# Patient Record
Sex: Female | Born: 1960 | Race: White | Hispanic: No | Marital: Married | Smoking: Never smoker
Health system: Southern US, Community
[De-identification: ages and names within clinical notes are randomized; demographics above are authoritative.]

## PROBLEM LIST (undated history)

## (undated) DIAGNOSIS — I10 Essential (primary) hypertension: Secondary | ICD-10-CM

## (undated) DIAGNOSIS — F329 Major depressive disorder, single episode, unspecified: Secondary | ICD-10-CM

## (undated) DIAGNOSIS — F419 Anxiety disorder, unspecified: Secondary | ICD-10-CM

## (undated) DIAGNOSIS — F32A Depression, unspecified: Secondary | ICD-10-CM

## (undated) HISTORY — DX: Anxiety disorder, unspecified: F41.9

## (undated) HISTORY — DX: Major depressive disorder, single episode, unspecified: F32.9

## (undated) HISTORY — PX: TONSILLECTOMY: SUR1361

## (undated) HISTORY — DX: Depression, unspecified: F32.A

## (undated) HISTORY — DX: Essential (primary) hypertension: I10

---

## 1981-03-14 HISTORY — PX: BREAST CYST EXCISION: SHX579

## 1981-03-14 HISTORY — PX: REDUCTION MAMMAPLASTY: SUR839

## 1981-03-14 HISTORY — PX: BREAST SURGERY: SHX581

## 1997-06-06 ENCOUNTER — Ambulatory Visit (HOSPITAL_COMMUNITY): Admission: RE | Admit: 1997-06-06 | Discharge: 1997-06-06 | Payer: Self-pay | Admitting: *Deleted

## 1997-08-19 ENCOUNTER — Ambulatory Visit (HOSPITAL_COMMUNITY): Admission: RE | Admit: 1997-08-19 | Discharge: 1997-08-19 | Payer: Self-pay | Admitting: Obstetrics and Gynecology

## 1997-08-27 ENCOUNTER — Ambulatory Visit (HOSPITAL_COMMUNITY): Admission: RE | Admit: 1997-08-27 | Discharge: 1997-08-27 | Payer: Self-pay | Admitting: Obstetrics and Gynecology

## 1997-11-15 ENCOUNTER — Inpatient Hospital Stay (HOSPITAL_COMMUNITY): Admission: AD | Admit: 1997-11-15 | Discharge: 1997-11-17 | Payer: Self-pay | Admitting: Obstetrics and Gynecology

## 1997-12-26 ENCOUNTER — Other Ambulatory Visit: Admission: RE | Admit: 1997-12-26 | Discharge: 1997-12-26 | Payer: Self-pay | Admitting: Obstetrics and Gynecology

## 2000-03-14 HISTORY — PX: FOOT SURGERY: SHX648

## 2003-08-26 ENCOUNTER — Encounter: Admission: RE | Admit: 2003-08-26 | Discharge: 2003-08-26 | Payer: Self-pay | Admitting: Obstetrics and Gynecology

## 2004-03-14 HISTORY — PX: KNEE SURGERY: SHX244

## 2004-08-26 ENCOUNTER — Ambulatory Visit (HOSPITAL_COMMUNITY): Admission: RE | Admit: 2004-08-26 | Discharge: 2004-08-26 | Payer: Self-pay | Admitting: Obstetrics and Gynecology

## 2004-09-10 ENCOUNTER — Encounter: Admission: RE | Admit: 2004-09-10 | Discharge: 2004-09-10 | Payer: Self-pay | Admitting: Obstetrics and Gynecology

## 2005-11-21 ENCOUNTER — Ambulatory Visit (HOSPITAL_COMMUNITY): Admission: RE | Admit: 2005-11-21 | Discharge: 2005-11-21 | Payer: Self-pay | Admitting: Obstetrics and Gynecology

## 2006-04-27 ENCOUNTER — Ambulatory Visit: Payer: Self-pay | Admitting: Obstetrics & Gynecology

## 2006-05-01 ENCOUNTER — Ambulatory Visit (HOSPITAL_COMMUNITY): Admission: RE | Admit: 2006-05-01 | Discharge: 2006-05-01 | Payer: Self-pay | Admitting: Gynecology

## 2006-05-02 ENCOUNTER — Ambulatory Visit: Payer: Self-pay | Admitting: Family Medicine

## 2006-05-16 ENCOUNTER — Ambulatory Visit: Payer: Self-pay | Admitting: Family Medicine

## 2006-06-16 ENCOUNTER — Ambulatory Visit (HOSPITAL_COMMUNITY): Admission: RE | Admit: 2006-06-16 | Discharge: 2006-06-16 | Payer: Self-pay | Admitting: Family Medicine

## 2006-06-16 ENCOUNTER — Ambulatory Visit: Payer: Self-pay | Admitting: Family Medicine

## 2006-07-11 ENCOUNTER — Ambulatory Visit: Payer: Self-pay | Admitting: Family Medicine

## 2006-08-22 ENCOUNTER — Emergency Department: Payer: Self-pay | Admitting: Emergency Medicine

## 2006-12-13 LAB — CONVERTED CEMR LAB: Pap Smear: NORMAL

## 2006-12-26 ENCOUNTER — Ambulatory Visit: Payer: Self-pay | Admitting: Family Medicine

## 2006-12-26 ENCOUNTER — Encounter: Payer: Self-pay | Admitting: Family Medicine

## 2007-02-27 ENCOUNTER — Ambulatory Visit (HOSPITAL_COMMUNITY): Admission: RE | Admit: 2007-02-27 | Discharge: 2007-02-27 | Payer: Self-pay | Admitting: Gynecology

## 2007-04-25 ENCOUNTER — Ambulatory Visit: Payer: Self-pay | Admitting: Family Medicine

## 2007-04-25 DIAGNOSIS — F411 Generalized anxiety disorder: Secondary | ICD-10-CM | POA: Insufficient documentation

## 2007-04-25 DIAGNOSIS — I10 Essential (primary) hypertension: Secondary | ICD-10-CM

## 2007-04-25 DIAGNOSIS — F329 Major depressive disorder, single episode, unspecified: Secondary | ICD-10-CM

## 2007-04-25 LAB — CONVERTED CEMR LAB
Bilirubin Urine: NEGATIVE
Ketones, urine, test strip: NEGATIVE
Nitrite: NEGATIVE
Specific Gravity, Urine: 1.015
pH: 7

## 2007-04-27 ENCOUNTER — Ambulatory Visit: Payer: Self-pay | Admitting: Family Medicine

## 2007-04-30 LAB — CONVERTED CEMR LAB
Albumin: 4.2 g/dL (ref 3.5–5.2)
BUN: 14 mg/dL (ref 6–23)
Basophils Absolute: 0 10*3/uL (ref 0.0–0.1)
Cholesterol: 185 mg/dL (ref 0–200)
Creatinine, Ser: 0.7 mg/dL (ref 0.4–1.2)
GFR calc Af Amer: 115 mL/min
HCT: 35.4 % — ABNORMAL LOW (ref 36.0–46.0)
HDL: 68.5 mg/dL (ref >39.0)
Hemoglobin: 11.9 g/dL — ABNORMAL LOW (ref 12.0–15.0)
LDL Cholesterol: 106 mg/dL — ABNORMAL HIGH (ref 0–99)
Lymphocytes Relative: 32.9 % (ref 12.0–46.0)
MCHC: 33.7 g/dL (ref 30.0–36.0)
MCV: 95.8 fL (ref 78.0–100.0)
Monocytes Absolute: 0.6 10*3/uL (ref 0.2–0.7)
Monocytes Relative: 10.8 % (ref 3.0–11.0)
Neutro Abs: 3.1 10*3/uL (ref 1.4–7.7)
Neutrophils Relative %: 52.8 % (ref 43.0–77.0)
Potassium: 4.6 meq/L (ref 3.5–5.1)
RDW: 12 % (ref 11.5–14.6)
Sodium: 139 meq/L (ref 135–145)
TSH: 0.8 microintl units/mL (ref 0.35–5.50)
Total Bilirubin: 0.7 mg/dL (ref 0.3–1.2)
Total Protein: 6.7 g/dL (ref 6.0–8.3)

## 2007-05-06 ENCOUNTER — Ambulatory Visit: Payer: Self-pay | Admitting: Family Medicine

## 2007-05-09 ENCOUNTER — Ambulatory Visit: Payer: Self-pay | Admitting: Family Medicine

## 2007-10-10 ENCOUNTER — Ambulatory Visit: Payer: Self-pay | Admitting: Family Medicine

## 2007-10-10 LAB — CONVERTED CEMR LAB
ALT: 11 units/L (ref 0–35)
AST: 21 units/L (ref 0–37)
Alkaline Phosphatase: 53 units/L (ref 39–117)
Bilirubin Urine: NEGATIVE
Bilirubin, Direct: 0.1 mg/dL (ref 0.0–0.3)
CO2: 31 meq/L (ref 19–32)
Chloride: 101 meq/L (ref 96–112)
GFR calc Af Amer: 114 mL/min
Glucose, Bld: 90 mg/dL (ref 70–99)
Glucose, Urine, Semiquant: 250
Lymphocytes Relative: 32.1 % (ref 12.0–46.0)
Monocytes Relative: 10.6 % (ref 3.0–12.0)
Platelets: 193 10*3/uL (ref 150–400)
Potassium: 4.3 meq/L (ref 3.5–5.1)
RDW: 12 % (ref 11.5–14.6)
Sodium: 138 meq/L (ref 135–145)
Specific Gravity, Urine: 1.02
Total Protein: 6.6 g/dL (ref 6.0–8.3)
WBC Urine, dipstick: NEGATIVE
WBC: 6.5 10*3/uL (ref 4.5–10.5)
pH: 6

## 2007-10-11 ENCOUNTER — Encounter: Payer: Self-pay | Admitting: Family Medicine

## 2007-10-19 ENCOUNTER — Encounter: Payer: Self-pay | Admitting: Family Medicine

## 2007-11-22 ENCOUNTER — Encounter: Payer: Self-pay | Admitting: Family Medicine

## 2007-12-10 ENCOUNTER — Encounter: Payer: Self-pay | Admitting: Family Medicine

## 2007-12-10 ENCOUNTER — Ambulatory Visit: Payer: Self-pay | Admitting: Gastroenterology

## 2007-12-13 ENCOUNTER — Telehealth: Payer: Self-pay | Admitting: Family Medicine

## 2007-12-13 DIAGNOSIS — N83209 Unspecified ovarian cyst, unspecified side: Secondary | ICD-10-CM

## 2007-12-14 ENCOUNTER — Encounter: Payer: Self-pay | Admitting: Family Medicine

## 2008-01-07 ENCOUNTER — Encounter: Payer: Self-pay | Admitting: Family Medicine

## 2008-02-28 ENCOUNTER — Encounter: Payer: Self-pay | Admitting: Family Medicine

## 2008-03-05 ENCOUNTER — Telehealth: Payer: Self-pay | Admitting: Family Medicine

## 2008-05-02 ENCOUNTER — Encounter (INDEPENDENT_AMBULATORY_CARE_PROVIDER_SITE_OTHER): Payer: Self-pay | Admitting: *Deleted

## 2008-05-28 ENCOUNTER — Encounter: Payer: Self-pay | Admitting: Family Medicine

## 2008-10-10 ENCOUNTER — Ambulatory Visit: Payer: Self-pay | Admitting: Family Medicine

## 2008-10-14 ENCOUNTER — Ambulatory Visit: Payer: Self-pay | Admitting: Family Medicine

## 2008-10-15 LAB — CONVERTED CEMR LAB
ALT: 10 units/L (ref 0–35)
Basophils Relative: 0.2 % (ref 0.0–3.0)
Bilirubin, Direct: 0 mg/dL (ref 0.0–0.3)
Chloride: 106 meq/L (ref 96–112)
Creatinine, Ser: 0.6 mg/dL (ref 0.4–1.2)
Eosinophils Absolute: 0.2 10*3/uL (ref 0.0–0.7)
Hemoglobin: 12.1 g/dL (ref 12.0–15.0)
LDL Cholesterol: 93 mg/dL (ref 0–99)
MCHC: 34.7 g/dL (ref 30.0–36.0)
MCV: 95.7 fL (ref 78.0–100.0)
Monocytes Absolute: 0.7 10*3/uL (ref 0.1–1.0)
Neutro Abs: 5.9 10*3/uL (ref 1.4–7.7)
Neutrophils Relative %: 66.6 % (ref 43.0–77.0)
RBC: 3.64 M/uL — ABNORMAL LOW (ref 3.87–5.11)
Total Bilirubin: 0.7 mg/dL (ref 0.3–1.2)
Total CHOL/HDL Ratio: 3
Triglycerides: 106 mg/dL (ref 0.0–149.0)

## 2008-10-23 ENCOUNTER — Encounter: Payer: Self-pay | Admitting: Family Medicine

## 2008-11-19 ENCOUNTER — Ambulatory Visit: Payer: Self-pay | Admitting: Family Medicine

## 2008-11-19 ENCOUNTER — Encounter: Payer: Self-pay | Admitting: Family Medicine

## 2008-11-19 DIAGNOSIS — R1011 Right upper quadrant pain: Secondary | ICD-10-CM | POA: Insufficient documentation

## 2008-11-20 LAB — CONVERTED CEMR LAB
Basophils Absolute: 0 10*3/uL (ref 0.0–0.1)
Bilirubin, Direct: 0 mg/dL (ref 0.0–0.3)
Calcium: 9.1 mg/dL (ref 8.4–10.5)
Creatinine, Ser: 0.6 mg/dL (ref 0.4–1.2)
Eosinophils Absolute: 0.1 10*3/uL (ref 0.0–0.7)
HCT: 33.3 % — ABNORMAL LOW (ref 36.0–46.0)
Hemoglobin: 11.3 g/dL — ABNORMAL LOW (ref 12.0–15.0)
Lipase: 51 units/L (ref 11.0–59.0)
Lymphs Abs: 1.3 10*3/uL (ref 0.7–4.0)
MCHC: 33.9 g/dL (ref 30.0–36.0)
MCV: 97.3 fL (ref 78.0–100.0)
Neutro Abs: 3.8 10*3/uL (ref 1.4–7.7)
RDW: 12.4 % (ref 11.5–14.6)
Total Bilirubin: 0.9 mg/dL (ref 0.3–1.2)
Total Protein: 6.2 g/dL (ref 6.0–8.3)

## 2008-11-21 ENCOUNTER — Ambulatory Visit: Payer: Self-pay | Admitting: Family Medicine

## 2008-11-21 ENCOUNTER — Encounter: Payer: Self-pay | Admitting: Family Medicine

## 2008-11-21 LAB — CONVERTED CEMR LAB
ALT: 62 units/L — ABNORMAL HIGH (ref 0–35)
AST: 46 units/L — ABNORMAL HIGH (ref 0–37)
Albumin: 3.8 g/dL (ref 3.5–5.2)
Alkaline Phosphatase: 69 units/L (ref 39–117)
Basophils Absolute: 0 10*3/uL (ref 0.0–0.1)
Basophils Relative: 0.5 % (ref 0.0–3.0)
Eosinophils Relative: 3 % (ref 0.0–5.0)
GFR calc non Af Amer: 112.26 mL/min (ref >60)
Glucose, Bld: 96 mg/dL (ref 70–99)
HCT: 32.2 % — ABNORMAL LOW (ref 36.0–46.0)
Hemoglobin: 11 g/dL — ABNORMAL LOW (ref 12.0–15.0)
Lymphs Abs: 1.5 10*3/uL (ref 0.7–4.0)
Monocytes Relative: 11.5 % (ref 3.0–12.0)
Neutro Abs: 2.8 10*3/uL (ref 1.4–7.7)
Potassium: 4.8 meq/L (ref 3.5–5.1)
RBC: 3.36 M/uL — ABNORMAL LOW (ref 3.87–5.11)
RDW: 12.2 % (ref 11.5–14.6)
Sodium: 142 meq/L (ref 135–145)
Total Protein: 6.7 g/dL (ref 6.0–8.3)

## 2008-11-25 LAB — CONVERTED CEMR LAB: Hepatitis B Surface Ag: NEGATIVE

## 2008-12-10 ENCOUNTER — Ambulatory Visit: Payer: Self-pay | Admitting: Family Medicine

## 2008-12-11 LAB — CONVERTED CEMR LAB
ALT: 15 units/L (ref 0–35)
AST: 21 units/L (ref 0–37)
Albumin: 3.9 g/dL (ref 3.5–5.2)
Alkaline Phosphatase: 46 units/L (ref 39–117)
Total Protein: 6.5 g/dL (ref 6.0–8.3)

## 2009-04-03 ENCOUNTER — Ambulatory Visit: Payer: Self-pay | Admitting: Family Medicine

## 2009-04-03 ENCOUNTER — Other Ambulatory Visit: Admission: RE | Admit: 2009-04-03 | Discharge: 2009-04-03 | Payer: Self-pay | Admitting: Family Medicine

## 2009-04-06 ENCOUNTER — Encounter: Admission: RE | Admit: 2009-04-06 | Discharge: 2009-04-06 | Payer: Self-pay | Admitting: Family Medicine

## 2009-04-07 ENCOUNTER — Encounter (INDEPENDENT_AMBULATORY_CARE_PROVIDER_SITE_OTHER): Payer: Self-pay | Admitting: *Deleted

## 2009-04-07 LAB — CONVERTED CEMR LAB: Pap Smear: NEGATIVE

## 2009-05-05 ENCOUNTER — Ambulatory Visit: Payer: Self-pay | Admitting: Family Medicine

## 2009-05-06 ENCOUNTER — Telehealth: Payer: Self-pay | Admitting: Family Medicine

## 2009-05-08 ENCOUNTER — Ambulatory Visit: Payer: Self-pay | Admitting: Cardiology

## 2009-07-16 ENCOUNTER — Ambulatory Visit: Payer: Self-pay | Admitting: Internal Medicine

## 2009-10-28 ENCOUNTER — Ambulatory Visit: Payer: Self-pay | Admitting: Family Medicine

## 2009-10-28 ENCOUNTER — Encounter (INDEPENDENT_AMBULATORY_CARE_PROVIDER_SITE_OTHER): Payer: Self-pay | Admitting: *Deleted

## 2009-10-28 ENCOUNTER — Encounter: Admission: RE | Admit: 2009-10-28 | Discharge: 2009-10-28 | Payer: Self-pay | Admitting: Family Medicine

## 2009-10-30 ENCOUNTER — Ambulatory Visit: Payer: Self-pay | Admitting: Family Medicine

## 2009-11-02 ENCOUNTER — Telehealth: Payer: Self-pay | Admitting: Family Medicine

## 2009-11-06 ENCOUNTER — Encounter: Admission: RE | Admit: 2009-11-06 | Discharge: 2009-11-06 | Payer: Self-pay | Admitting: Family Medicine

## 2009-11-11 ENCOUNTER — Ambulatory Visit: Payer: Self-pay | Admitting: Family Medicine

## 2010-01-26 ENCOUNTER — Ambulatory Visit: Payer: Self-pay | Admitting: Family Medicine

## 2010-04-13 NOTE — Progress Notes (Signed)
Summary: headache  Phone Note Call from Patient Call back at [phone]   Caller: Patient Call For: Kerby Nora MD Summary of Call: Patient says that she still has the constant headache. This has been going on for over a week. Patient is asking what is the next step. She says that Dr. Ermalene Searing had mentioned her having an MRI.  Initial call taken by: Melody Comas,  November 02, 2009 11:37 AM  Follow-up for Phone Call        she needs to be reevaluated with headaches that bad. Follow-up by: Hannah Beat MD,  November 02, 2009 11:44 AM  Additional Follow-up for Phone Call Additional follow up Details #1::        Patient says that she was just in the office on Friday to see Dr. Ermalene Searing and she does not want to come back in to be seen again, she says that she cannot keep paying the copays because it adds up.  She doesn't see the need to come in to see the doctor again.  Dr. Patsy Lager spoke with patient on the telephone.  Consuello Masse CMA   Additional Follow-up by: Benny Lennert CMA [name]),  November 02, 2009 12:22 PM    Additional Follow-up for Phone Call Additional follow up Details #2::    MRI ordered. Follow-up by: Hannah Beat MD,  November 02, 2009 12:55 PM

## 2010-04-13 NOTE — Letter (Signed)
Summary: Out of Work  [employer] [facility]  [address] [place], [place] [address]   Phone: [phone]  Fax: [phone]    October 28, 2009   Employee:  [facility]    To Whom It May Concern:   For Medical reasons, please excuse the above named employee from work for the following dates:  Start:  October 28, 2009 4:19 PM   End:   May return to work August 19th Night after follow up if better  If you need additional information, please feel free to contact our office.         Sincerely,   Kerby Nora MD

## 2010-04-13 NOTE — Assessment & Plan Note (Signed)
Summary: CPX / LFW   Vital Signs:  Patient profile:   50 year old female Height:      65 inches Weight:      131.2 pounds BMI:     21.91 Temp:     97.8 degrees F oral Pulse rate:   72 / minute Pulse rhythm:   regular BP sitting:   140 / 80  (left arm) Cuff size:   regular  Vitals Entered By: Benny Lennert CMA [name]) (April 03, 2009 12:10 PM)  History of Present Illness: Chief complaint cpx  The patient is here for annual wellness exam and preventative care.     Doing well overall.   Depression, well controlled on celexa. needs refills.   Problems Prior to Update: 1)  Cough  (ICD-786.2) 2)  Routine Gynecological Examination  (ICD-V72.31) 3)  Abdominal Pain, Right Upper Quadrant  (ICD-789.01) 4)  Family History Breast Cancer 1st Degree Relative <50  (ICD-V16.3) 5)  Other Screening Mammogram  (ICD-V76.12) 6)  Colitis, Collagenous  (ICD-558.9) 7)  Ovarian Cyst, Left  (ICD-620.2) 8)  Low Back Pain, Acute  (ICD-724.2) 9)  Abdominal Pain, Right Upper Quadrant  (ICD-789.01) 10)  Well Adult Exam  (ICD-V70.0) 11)  Depression  (ICD-311) 12)  Anxiety  (ICD-300.00) 13)  Hypertension  (ICD-401.9)  Current Medications (verified): 1)  Celexa 40 Mg  Tabs (Citalopram Hydrobromide) .... Take 1 Tablet By Mouth Once A Day 2)  Multivitamins   Tabs (Multiple Vitamin) .... Take 1 Tablet By Mouth Once A Day 3)  Caltrate 600+d 600-400 Mg-Unit  Tabs (Calcium Carbonate-Vitamin D) .... Take 1 Tablet By Mouth Once A Day 4)  Fish Oil Concentrate 1000 Mg  Caps (Omega-3 Fatty Acids) .... Take 1 Capsule By Mouth Two Times A Day  Allergies (verified): No Known Drug Allergies  Past History:  Past medical, surgical, family and social histories (including risk factors) reviewed, and no changes noted (except as noted below).  Past Medical History: Reviewed history from 04/25/2007 and no changes required. Hypertension Anxiety Depression  Past Surgical History: Reviewed history from  04/25/2007 and no changes required. 2002 R foot surgery  2006 L knee surgery, torn ligiments Tonsillectomy breast reduction 1983  Family History: Reviewed history from 10/10/2008 and no changes required. father: healthy mother: HTN, CAD (MI at age 3), melanoma, osteoporosi MGM; breast cancer MGF: colon cancer sister: breast cancer brother CAD, CVA Family History Breast cancer 1st degree relative <50  Social History: Reviewed history from 04/25/2007 and no changes required. 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 no breast lesions, no nipple discharge. General:  Complains of fatigue and sweats; denies fever and weight loss. CV:  Denies chest pain or discomfort. Resp:  Complains of cough; denies shortness of breath and sputum productive;  Upon quesstioning about lung exam: grey mucus, productive cough x 1 month, pain in left upper chest  rare left scapular pain long history of second hand smoke and daily marijuana use. GI:  Denies abdominal pain, bloody stools, constipation, and diarrhea. GU:  Denies dysuria.  Physical Exam  General:  Well-developed,well-nourished,in no acute distress; alert,appropriate and cooperative throughout examination Eyes:  No corneal or conjunctival inflammation noted. EOMI. Perrla. Funduscopic exam benign, without hemorrhages,  exudates or papilledema. Vision grossly normal. Ears:  External ear exam shows no significant lesions or deformities.  Otoscopic examination reveals clear canals, tympanic membranes are intact bilaterally without bulging, retraction, inflammation or discharge. Hearing is grossly normal bilaterally. Nose:  External nasal examination shows no deformity  or inflammation. Nasal mucosa are pink and moist without lesions or exudates. Mouth:  Oral mucosa and oropharynx without lesions or exudates.  Teeth in good repair. Neck:  no carotid bruit or thyromegaly no cervical or supraclavicular lymphadenopathy  Chest Wall:  No deformities, masses, or tenderness noted. Breasts:  No mass, nodules, thickening, tenderness, bulging, retraction, inflamation, nipple discharge or skin changes noted.   Lungs:  Normal respiratory effort, chest expands symmetrically.? rhonchi on inspiration in left upper lung, apex..? rub Heart:  Normal rate and regular rhythm. S1 and S2 normal without gallop, murmur, click, rub or other extra sounds. Abdomen:  Bowel sounds positive,abdomen soft and non-tender without masses, organomegaly or hernias noted. Genitalia:  Normal introitus for age, no external lesions, no vaginal discharge, mucosa pink and moist, no vaginal or cervical lesions, no vaginal atrophy, no friaility or hemorrhage, normal uterus size and position, no adnexal masses or tenderness Pulses:  R and L posterior tibial pulses are full and equal bilaterally  Extremities:  no edema  Skin:  Intact without suspicious lesions or rashes Psych:  Cognition and judgment appear intact. Alert and cooperative with normal attention span and concentration. No apparent delusions, illusions, hallucinations  Denies SI.   Impression & Recommendations:  Problem # 1:  WELL ADULT EXAM (ICD-V70.0) Reviewed preventive care protocols, scheduled due services, and updated immunizations. Encouraged exercise, weight loss, healthy eating habits.   Problem # 2:  Gynecological examination-routine (ICD-V72.31) PAp pending.   Problem # 3:  COUGH (ICD-786.2) Abnormal lung exam on left apex. Given risk factors,and symptoms elicited from pt reluctantly on questioning..will cehck CXR for further eval.  Orders: Radiology Referral (Radiology)  Complete Medication List: 1)  Celexa 40 Mg Tabs  (Citalopram hydrobromide) .... Take 1 tablet by mouth once a day 2)  Multivitamins Tabs (Multiple vitamin) .... Take 1 tablet by mouth once a day 3)  Caltrate 600+d 600-400 Mg-unit Tabs (Calcium carbonate-vitamin d) .... Take 1 tablet by mouth once a day 4)  Fish Oil Concentrate 1000 Mg Caps (Omega-3 fatty acids) .... Take 1 capsule by mouth two times a day  Other Orders: Tdap => 54yrs IM (73220) Admin 1st Vaccine (25427) Admin 1st Vaccine [facility]) [phone])  Patient Instructions: 1)  Follow BP at home.Marland Kitchengoal BP 140/90 2)  Please schedule a follow-up appointment in 1 year.  3)  Referral Appointment Information 4)  Day/Date: 5)  Time: 6)  Place/MD: 7)  Address: 8)  Phone/Fax: 9)  Patient given appointment information. Information/Orders faxed/mailed.  Prescriptions: CELEXA 40 MG  TABS (CITALOPRAM HYDROBROMIDE) Take 1 tablet by mouth once a day  #90 x 3   Entered and Authorized by:   Kerby Nora MD   Signed by:   Kerby Nora MD on 04/03/2009   Method used:   Electronically to        [facility]  [address] (retail)       [address]       [place], [place]  [address]       Ph: [phone]       Fax: [phone]   RxID:   [id number]   Current Allergies (reviewed today): No known allergies  Flu  Vaccine Next Due:  Not Indicated TD Result Date:  04/03/2009 TD Result:  given TD Next Due:  10 yr    Past Medical History:    Reviewed history from 04/25/2007 and no changes required:       Hypertension       Anxiety       Depression  Past Surgical History:    Reviewed history from 04/25/2007 and no changes required:       2002 R foot surgery        2006 L knee surgery, torn ligiments       Tonsillectomy       breast reduction 1983      Tetanus/Td Vaccine    Vaccine Type: Tdap    Site: left deltoid    Mfr: GlaxoSmithKline    Dose: 0.5 ml    Route: IM    Given by: Benny Lennert CMA (AAMA)    Exp. Date: 05/09/2011    Lot #:  [id number]

## 2010-04-13 NOTE — Assessment & Plan Note (Signed)
Summary: F/U/CLE   Vital Signs:  Patient profile:   50 year old female Height:      65 inches Weight:      130.8 pounds BMI:     21.84 Temp:     98.5 degrees F oral Pulse rate:   72 / minute Pulse rhythm:   regular BP sitting:   150 / 92  (left arm) Cuff size:   regular  Vitals Entered By: Benny Lennert CMA [name]) (October 30, 2009 10:50 AM)  History of Present Illness: Chief complaint follow up severe headache  Severe headache: CT scan head negative. Movement makes it worse. Given phenergan, vicodin for pain. No history of migraines or headache.  She states headhace is tolerable but requiring vicodin every 5-6 hours..pain level at 4-5/10. Pain in occiput.  No new symptoms. No numbness, tingling, no weakness, no nausea (not needing phenergan since pain better).  Eating and drinking well.   Started lisinopril/HCTZ... improved some todfay but remains >140/90.  Mother with HTN, no family history of migraine, no aneyrsym  Allergies (verified): No Known Drug Allergies  Past History:  Past medical, surgical, family and social histories (including risk factors) reviewed, and no changes noted (except as noted below).  Past Medical History: Reviewed history from 04/25/2007 and no changes required. Hypertension Anxiety Depression  Past Surgical History: Reviewed history from 04/25/2007 and no changes required. 2002 R foot surgery  2006 L knee surgery, torn ligiments Tonsillectomy breast reduction 1983  Family History: Reviewed history from 10/10/2008 and no changes required. father: healthy mother: HTN, CAD (MI at age 61), melanoma, osteoporosi MGM; breast cancer MGF: colon cancer sister: breast cancer brother CAD, CVA Family History Breast cancer 1st degree relative <50  Social History: Reviewed history from 04/25/2007 and no changes required. 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 No tick bites.  General:  Denies fatigue and fever. CV:  Denies chest pain or discomfort. Resp:  Denies shortness of breath.  Physical Exam  General:  Well-developed,well-nourished,in no acute distress; alert,appropriate and cooperative throughout examination Head:  Normocephalic and atraumatic without obvious abnormalities. No apparent alopecia or balding. Eyes:  No corneal or conjunctival inflammation noted. EOMI. Perrla. Funduscopic exam benign, without hemorrhages, exudates or papilledema. Vision grossly normal. Ears:  External ear exam shows no significant lesions or deformities.  Otoscopic examination reveals clear canals, tympanic membranes are intact bilaterally without bulging, retraction, inflammation or discharge. Hearing is grossly normal bilaterally. Nose:  External nasal examination shows no deformity or inflammation. Nasal mucosa are pink and moist without lesions or exudates. Mouth:  Oral mucosa and oropharynx without lesions or exudates.  Teeth in good repair. Neck:  no carotid bruit or thyromegaly no cervical or supraclavicular lymphadenopathy Full ROM neck Lungs:  Normal respiratory effort, chest expands symmetrically. Lungs are clear to auscultation, no crackles or wheezes. Heart:  Normal rate and regular rhythm. S1 and S2 normal without gallop, murmur, click, rub or other extra sounds. Pulses:  R and L posterior tibial pulses are full and equal bilaterally  Extremities:  no edema Neurologic:  No cranial nerve deficits noted. Station and gait are normal. Plantar reflexes are down-going bilaterally. DTRs are symmetrical throughout. Sensory, motor and coordinative  functions appear intact.   Impression & Recommendations:  Problem # 1:  HEADACHE,  SEVERE (ICD-784.0) Possibly due to BP...still with concern of other etiology given still requiring vicodin. Will treat BP more aggressively ..follow.  Consider MRi if headahce not improving.  No clear suggestion of LYme disease, RMSF.   Her updated medication list for this problem includes:    Hydrocodone-acetaminophen 5-500 Mg Tabs (Hydrocodone-acetaminophen) .Marland Kitchen... 1 tab by mouth poq6 hours as needed pain  Problem # 2:  HYPERTENSION (ICD-401.9) Increase to 2 tab by mouth daily.Marland KitchenMarland KitchenCheck Cr at next OV.  Her updated medication list for this problem includes:    Lisinopril-hydrochlorothiazide 10-12.5 Mg Tabs (Lisinopril-hydrochlorothiazide) .Marland Kitchen... 1 tab by mouth daily  Complete Medication List: 1)  Celexa 40 Mg Tabs (Citalopram hydrobromide) .... Take 1 tablet by mouth once a day 2)  Multivitamins Tabs (Multiple vitamin) .... Take 1 tablet by mouth once a day 3)  Caltrate 600+d 600-400 Mg-unit Tabs (Calcium carbonate-vitamin d) .... Take 1 tablet by mouth once a day 4)  Fish Oil Concentrate 1000 Mg Caps (Omega-3 fatty acids) .... Take 2  capsules  by mouth once daily 5)  Healthy Colon Caps (Probiotic product) .... Once daily 6)  Lisinopril-hydrochlorothiazide 10-12.5 Mg Tabs (Lisinopril-hydrochlorothiazide) .Marland Kitchen.. 1 tab by mouth daily 7)  Hydrocodone-acetaminophen 5-500 Mg Tabs (Hydrocodone-acetaminophen) .Marland Kitchen.. 1 tab by mouth poq6 hours as needed pain 8)  Promethazine Hcl 25 Mg Tabs (Promethazine hcl) .Marland Kitchen.. 1 tab by mouth q 6 hours as needed nausea  Patient Instructions: 1)  Increase BP med to 2 tab by mouth daily. 2)  Follow BPs at home.  3)  Follow up in 1-2 weeks HTN check.  4)  Go to ER if severe pain returns or vicodin not controlling headache. Prescriptions: HYDROCODONE-ACETAMINOPHEN 5-500 MG TABS (HYDROCODONE-ACETAMINOPHEN) 1 tab by mouth poq6 hours as needed pain  #30 x 0   Entered and Authorized by:   Kerby Nora MD   Signed by:   Kerby Nora MD on 10/30/2009   Method used:   Print  then Give to Patient   RxID:   [id number]   Current Allergies (reviewed today): No known allergies

## 2010-04-13 NOTE — Assessment & Plan Note (Signed)
Summary: lung exam per Bridget Reeves/hmw   Vital Signs:  Patient profile:   50 year old female Height:      65 inches Weight:      132.38 pounds BMI:     22.11 Temp:     98.2 degrees F oral Pulse rate:   72 / minute Pulse rhythm:   regular BP sitting:   132 / 60  (left arm) Cuff size:   regular  Vitals Entered By: [name] CMA [name]) (May 05, 2009 11:07 AM) CC: Lung exam per Dr. Ermalene Searing   History of Present Illness: Chronic cough and abnormal lunmg exam in marijuana smoker (daily Had neg CXR. Cough, hacking in AMS... productive. No SOB, no wheeze. Sometime difficult to get full deep breath.  Slight upper back pain between shoulder.  Some night sweats. no weight loss. Some nausea in last week.   Good appetitie.  No fatigue.   Problems Prior to Update: 1)  Cough  (ICD-786.2) 2)  Routine Gynecological Examination  (ICD-V72.31) 3)  Abdominal Pain, Right Upper Quadrant  (ICD-789.01) 4)  Family History Breast Cancer 1st Degree Relative <50  (ICD-V16.3) 5)  Other Screening Mammogram  (ICD-V76.12) 6)  Colitis, Collagenous  (ICD-558.9) 7)  Ovarian Cyst, Left  (ICD-620.2) 8)  Low Back Pain, Acute  (ICD-724.2) 9)  Abdominal Pain, Right Upper Quadrant  (ICD-789.01) 10)  Well Adult Exam  (ICD-V70.0) 11)  Depression  (ICD-311) 12)  Anxiety  (ICD-300.00) 13)  Hypertension  (ICD-401.9)  Current Medications (verified): 1)  Celexa 40 Mg  Tabs (Citalopram Hydrobromide) .... Take 1 Tablet By Mouth Once A Day 2)  Multivitamins   Tabs (Multiple Vitamin) .... Take 1 Tablet By Mouth Once A Day 3)  Caltrate 600+d 600-400 Mg-Unit  Tabs (Calcium Carbonate-Vitamin D) .... Take 1 Tablet By Mouth Once A Day 4)  Fish Oil Concentrate 1000 Mg  Caps (Omega-3 Fatty Acids) .... Take 2  Capsules  By Mouth Once Daily 5)  Healthy Colon  Caps (Probiotic Product) .... Once Daily  Allergies (verified): No Known Drug Allergies  Past History:  Past medical, surgical, family and social histories  (including risk factors) reviewed, and no changes noted (except as noted below).  Past Medical History: Reviewed history from 04/25/2007 and no changes required. Hypertension Anxiety Depression  Past Surgical History: Reviewed history from 04/25/2007 and no changes required. 2002 R foot surgery  2006 L knee surgery, torn ligiments Tonsillectomy breast reduction 1983  Family History: Reviewed history from 10/10/2008 and no changes required. father: healthy mother: HTN, CAD (MI at age 69), melanoma, osteoporosi MGM; breast cancer MGF: colon cancer sister: breast cancer brother CAD, CVA Family History Breast cancer 1st degree relative <50  Social History: Reviewed history from 04/25/2007 and no changes required. 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 CV:  Denies chest pain or discomfort. Resp:  Denies coughing up blood and shortness of breath. GI:  Complains of abdominal pain; rare . GU:  Denies dysuria.  Physical Exam  General:  Well-developed,well-nourished,in no acute distress; alert,appropriate and cooperative throughout examination Ears:  External ear exam shows no significant lesions or deformities.  Otoscopic examination reveals clear canals, tympanic membranes are intact bilaterally without bulging, retraction, inflammation or discharge. Hearing is grossly normal bilaterally. Nose:  External nasal examination shows no deformity or inflammation. Nasal mucosa are pink and moist without lesions or exudates. Mouth:  Oral mucosa and oropharynx without lesions or exudates.  Teeth in good repair. Neck:  ? more prominant left shoulder  ? left suprclavicular prominence..no specific adenopathy  palpated Lungs:  Normal respiratory effort, chest expands symmetrically. Definate rhonchi on inspiration in left upper lung, apex... sounds like rubbing...may be louder than at last exam.  Heart:  Normal rate and regular rhythm. S1 and S2 normal without gallop, murmur, click, rub or other extra sounds.   Impression & Recommendations:  Problem # 1:  NONSPCIFC ABN FINDING RAD & OTH EXAM LUNG FIELD (ICD-793.1) Given some red flags and continued abnormal lung sounds in marijuana smoker...will eval further with Chest Ct with contrast. Pt agreeable.  Orders: Radiology Referral (Radiology)  Problem # 2:  COUGH (ICD-786.2)  Orders: Radiology Referral (Radiology)  Complete Medication List: 1)  Celexa 40 Mg Tabs (Citalopram hydrobromide) .... Take 1 tablet by mouth once a day 2)  Multivitamins Tabs (Multiple vitamin) .... Take 1 tablet by mouth once a day 3)  Caltrate 600+d 600-400 Mg-unit Tabs (Calcium carbonate-vitamin d) .... Take 1 tablet by mouth once a day 4)  Fish Oil Concentrate 1000 Mg Caps (Omega-3 fatty acids) .... Take 2  capsules  by mouth once daily 5)  Healthy Colon Caps (Probiotic product) .... Once daily  Patient Instructions: 1)  Referral Appointment Information 2)  Day/Date: 3)  Time: 4)  Place/MD: 5)  Address: 6)  Phone/Fax: 7)  Patient given appointment information. Information/Orders faxed/mailed.   Current Allergies (reviewed today): No known allergies

## 2010-04-13 NOTE — Assessment & Plan Note (Signed)
Summary: HEADACHE X SEVERAL DAYS   Vital Signs:  Patient profile:   50 year old female Height:      65 inches Weight:      130.2 pounds BMI:     21.74 Temp:     98.5 degrees F oral Pulse rate:   72 / minute Pulse rhythm:   regular BP sitting:   160 / 100  (left arm) Cuff size:   regular  Vitals Entered By: Benny Lennert CMA [name]) (October 28, 2009 3:16 PM)  History of Present Illness: Chief complaint Headache for several days   HAs had headache for past 6 days. 10/10. Pain is in occiput.Marland Kitchenthrobbing. Every time heart beats she can feel it. Some nausea, sensitive to light and sound.  No falls hitting head. No numbness, no tingling, no weakness.  N slurred speech, some intemittant  confusion.   Has tried aleve...no relief.  "Worst headache I have  had in her life"  No personal istory of migraine, no family history of migraine.  HTN,...occ elevated in apst but was controlled with lifestyle...now very poorly controlled. Not measuring at home.   Problems Prior to Update: 1)  Nonspcifc Abn Finding Rad & Oth Exam Lung Field  (ICD-793.1) 2)  Cough  (ICD-786.2) 3)  Routine Gynecological Examination  (ICD-V72.31) 4)  Abdominal Pain, Right Upper Quadrant  (ICD-789.01) 5)  Family History Breast Cancer 1st Degree Relative <50  (ICD-V16.3) 6)  Other Screening Mammogram  (ICD-V76.12) 7)  Colitis, Collagenous  (ICD-558.9) 8)  Ovarian Cyst, Left  (ICD-620.2) 9)  Low Back Pain, Acute  (ICD-724.2) 10)  Abdominal Pain, Right Upper Quadrant  (ICD-789.01) 11)  Well Adult Exam  (ICD-V70.0) 12)  Depression  (ICD-311) 13)  Anxiety  (ICD-300.00) 14)  Hypertension  (ICD-401.9)  Current Medications (verified): 1)  Celexa 40 Mg  Tabs (Citalopram Hydrobromide) .... Take 1 Tablet By Mouth Once A Day 2)  Multivitamins   Tabs (Multiple Vitamin) .... Take 1 Tablet By Mouth Once A Day 3)  Caltrate 600+d 600-400 Mg-Unit  Tabs (Calcium Carbonate-Vitamin D) .... Take 1 Tablet By Mouth Once A  Day 4)  Fish Oil Concentrate 1000 Mg  Caps (Omega-3 Fatty Acids) .... Take 2  Capsules  By Mouth Once Daily 5)  Healthy Colon  Caps (Probiotic Product) .... Once Daily  Allergies (verified): No Known Drug Allergies PMH-FH-SH reviewed-no changes except otherwise noted  Review of Systems General:  Denies fatigue and fever. CV:  Denies chest pain or discomfort. Resp:  Denies shortness of breath. GI:  Denies abdominal pain. GU:  Denies dysuria. Neuro:  Denies disturbances in coordination, falling down, inability to speak, memory loss, numbness, poor balance, seizures, sensation of room spinning, visual disturbances, and weakness. Psych:  Complains of anxiety.  Physical Exam  General:  tearful in moderate distress Head:  Posterior occiput...ttp diffusely Eyes:  No corneal or conjunctival inflammation noted. EOMI. Perrla. Funduscopic exam benign, without hemorrhages, exudates or papilledema. Vision grossly normal. Ears:  External ear exam shows no significant lesions or deformities.  Otoscopic examination reveals clear canals, tympanic membranes are intact bilaterally without bulging, retraction, inflammation or discharge. Hearing is grossly normal bilaterally. Nose:  External nasal examination shows no deformity or inflammation. Nasal mucosa are pink and moist without lesions or exudates. Mouth:  MMM Neck:  no carotid bruit or thyromegaly no cervical or supraclavicular lymphadenopathy  Lungs:  Normal respiratory effort, chest expands symmetrically. Lungs are clear to auscultation, no crackles or wheezes. Heart:  Normal rate and regular rhythm.  S1 and S2 normal without gallop, murmur, click, rub or other extra sounds. Pulses:  R and L posterior tibial pulses are full and equal bilaterally  Neurologic:  No cranial nerve deficits noted. Station and gait are normal. Plantar reflexes are down-going bilaterally. DTRs are symmetrical throughout. Sensory, motor and coordinative functions appear  intact.   Impression & Recommendations:  Problem # 1:  HEADACHE, SEVERE (ICD-784.0) Given very severe headhace in pt with no personal history headhaces...concerning for acute bleed, acute change...send for head CT. If continues and not improving..consider MRI brain for further eval. No current neurologic changes.  BP is poorly controlled and may be from pain or causing headahce to be more severe.  Her updated medication list for this problem includes:    Hydrocodone-acetaminophen 5-500 Mg Tabs (Hydrocodone-acetaminophen) .Marland Kitchen... 1 tab by mouth poq6 hours as needed pain  Orders: Radiology Referral (Radiology)  Problem # 2:  HYPERTENSION (ICD-401.9) Start BP medicaiton ASAP. Close follow up in 2 days.  Her updated medication list for this problem includes:    Lisinopril-hydrochlorothiazide 10-12.5 Mg Tabs (Lisinopril-hydrochlorothiazide) .Marland Kitchen... 1 tab by mouth daily  Complete Medication List: 1)  Celexa 40 Mg Tabs (Citalopram hydrobromide) .... Take 1 tablet by mouth once a day 2)  Multivitamins Tabs (Multiple vitamin) .... Take 1 tablet by mouth once a day 3)  Caltrate 600+d 600-400 Mg-unit Tabs (Calcium carbonate-vitamin d) .... Take 1 tablet by mouth once a day 4)  Fish Oil Concentrate 1000 Mg Caps (Omega-3 fatty acids) .... Take 2  capsules  by mouth once daily 5)  Healthy Colon Caps (Probiotic product) .... Once daily 6)  Lisinopril-hydrochlorothiazide 10-12.5 Mg Tabs (Lisinopril-hydrochlorothiazide) .Marland Kitchen.. 1 tab by mouth daily 7)  Hydrocodone-acetaminophen 5-500 Mg Tabs (Hydrocodone-acetaminophen) .Marland Kitchen.. 1 tab by mouth poq6 hours as needed pain 8)  Promethazine Hcl 25 Mg Tabs (Promethazine hcl) .Marland Kitchen.. 1 tab by mouth q 6 hours as needed nausea  Patient Instructions: 1)  Start BP medication. 2)   Referral Appointment Information 3)  Day/Date: 4)  Time: 5)  Place/MD: 6)  Address: 7)  Phone/Fax: 8)  Patient given appointment information. Information/Orders faxed/mailed.  9)  Pherergan  for nausea as needed.  10)   Vicodin for severe pain.  11)   Go to ER for severe pain. 12)  Follow up on Friday with [facility].  Prescriptions: PROMETHAZINE HCL 25 MG TABS (PROMETHAZINE HCL) 1 tab by mouth q 6 hours as needed nausea  #20 x 0   Entered and Authorized by:   Kerby Nora MD   Signed by:   Kerby Nora MD on 10/28/2009   Method used:   Print then Give to Patient   RxID:   [id number] HYDROCODONE-ACETAMINOPHEN 5-500 MG TABS (HYDROCODONE-ACETAMINOPHEN) 1 tab by mouth poq6 hours as needed pain  #20 x 0   Entered and Authorized by:   Kerby Nora MD   Signed by:   Kerby Nora MD on 10/28/2009   Method used:   Print then Give to Patient   RxID:   [id number] LISINOPRIL-HYDROCHLOROTHIAZIDE 10-12.5 MG TABS (LISINOPRIL-HYDROCHLOROTHIAZIDE) 1 tab by mouth daily  #30 x 11   Entered and Authorized by:   Kerby Nora MD   Signed by:   Kerby Nora MD on 10/28/2009   Method used:   Electronically to        [facility]  [address] (retail)       [address]       [place],  [place]  [address]       Ph: [phone]       Fax: [phone]   RxID:   [id number]   Current Allergies (reviewed today): No known allergies    Medication Administration  Injection # 1:    Medication: Ketorolac-Toradol 15mg     Diagnosis: HEADACHE, SEVERE (ICD-784.0)    Comments: 30 mg IM x 1   Orders Added: 1)  Radiology Referral [Radiology] 2)  Est. Patient Level IV [25366]  Appended Document: HEADACHE X SEVERAL DAYS    Clinical Lists Changes  Orders: Added new Service order of Ketorolac-Toradol 15mg  [phone]) - Signed Added new Service order of Ketorolac-Toradol 15mg  ([id number]) - Signed Added new Service order of Admin of Therapeutic Inj  intramuscular or subcutaneous ([id number]) - Signed       Medication Administration  Injection # 1:    Medication: Ketorolac-Toradol 15mg     Diagnosis: HEADACHE, SEVERE (ICD-784.0)    Route: IM    Site: RUOQ gluteus    Exp  Date: 05/13/2011    Lot #: [id number]    Mfr: Novartis    Patient tolerated injection without complications    Given by: Benny Lennert CMA (AAMA) (October 28, 2009 4:18 PM)  Injection # 2:    Medication: Ketorolac-Toradol 15mg     Diagnosis: HEADACHE, SEVERE (ICD-784.0)    Route: IM    Site: RUOQ gluteus    Exp Date: 05/13/2011    Lot #: [id number]    Mfr: Novartis  Orders Added: 1)  Ketorolac-Toradol 15mg  [J1885] 2)  Ketorolac-Toradol 15mg  [J1885] 3)  Admin of Therapeutic Inj  intramuscular or subcutaneous [60630]

## 2010-04-13 NOTE — Assessment & Plan Note (Signed)
Summary: ROA FOR 1-2 WEEK HTN CHECK/JRR   Vital Signs:  Patient profile:   50 year old female Height:      65 inches Weight:      130.0 pounds BMI:     21.71 Temp:     97.8 degrees F oral Pulse rate:   72 / minute Pulse rhythm:   regular BP sitting:   118 / 80  (left arm) Cuff size:   regular  Vitals Entered By: Benny Lennert CMA (AAMA) (November 11, 2009 8:30 AM)  History of Present Illness: Chief complaint Follow up BP  Recent headache..severe...head CT, and MRI brain negative. Since last seen...minimal headache. No new neurologic symptoms.    HTN..initiated on lisinopril HCTZ..BP improved at this  point on  higher dose of medicaiton.  Not checking BP med at home. No SE except dryness at back of throat. Now walking daily, eating healthy.  HAs noted possibly going through menopause..last menses 09/2009..she feels headache severe may have been a week prior to when she should have had menses.  Problems Prior to Update: 1)  Headache, Severe  (ICD-784.0) 2)  Nonspcifc Abn Finding Rad & Oth Exam Lung Field  (ICD-793.1) 3)  Cough  (ICD-786.2) 4)  Routine Gynecological Examination  (ICD-V72.31) 5)  Abdominal Pain, Right Upper Quadrant  (ICD-789.01) 6)  Family History Breast Cancer 1st Degree Relative <50  (ICD-V16.3) 7)  Other Screening Mammogram  (ICD-V76.12) 8)  Colitis, Collagenous  (ICD-558.9) 9)  Ovarian Cyst, Left  (ICD-620.2) 10)  Low Back Pain, Acute  (ICD-724.2) 11)  Abdominal Pain, Right Upper Quadrant  (ICD-789.01) 12)  Well Adult Exam  (ICD-V70.0) 13)  Depression  (ICD-311) 14)  Anxiety  (ICD-300.00) 15)  Hypertension  (ICD-401.9)  Current Medications (verified): 1)  Celexa 40 Mg  Tabs (Citalopram Hydrobromide) .... Take 1 Tablet By Mouth Once A Day 2)  Multivitamins   Tabs (Multiple Vitamin) .... Take 1 Tablet By Mouth Once A Day 3)  Caltrate 600+d 600-400 Mg-Unit  Tabs (Calcium Carbonate-Vitamin D) .... Take 1 Tablet By Mouth Once A Day 4)  Fish Oil  Concentrate 1000 Mg  Caps (Omega-3 Fatty Acids) .... Take 2  Capsules  By Mouth Once Daily 5)  Healthy Colon  Caps (Probiotic Product) .... Once Daily 6)  Lisinopril-Hydrochlorothiazide 10-12.5 Mg Tabs (Lisinopril-Hydrochlorothiazide) .... 2 Tab By Mouth Daily  Allergies (verified): No Known Drug Allergies  Past History:  Past medical, surgical, family and social histories (including risk factors) reviewed, and no changes noted (except as noted below).  Past Medical History: Reviewed history from 04/25/2007 and no changes required. Hypertension Anxiety Depression  Past Surgical History: Reviewed history from 04/25/2007 and no changes required. 2002 R foot surgery  2006 L knee surgery, torn ligiments Tonsillectomy breast reduction 1983  Family History: Reviewed history from 10/10/2008 and no changes required. father: healthy mother: HTN, CAD (MI at age 68), melanoma, osteoporosi MGM; breast cancer MGF: colon cancer sister: breast cancer brother CAD, CVA Family History Breast cancer 1st degree relative <50  Social History: Reviewed history from 04/25/2007 and no changes required. 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 CV:  Denies chest pain or discomfort. Resp:  Denies shortness of breath.  Physical Exam  General:  Well-developed,well-nourished,in no acute distress;  alert,appropriate and cooperative throughout examination Eyes:  No corneal or conjunctival inflammation noted. EOMI. Perrla. Funduscopic exam benign, without hemorrhages, exudates or papilledema. Vision grossly normal. Ears:  External ear exam shows no significant lesions or deformities.  Otoscopic examination reveals clear canals,  tympanic membranes are intact bilaterally without bulging, retraction, inflammation or discharge. Hearing is grossly normal bilaterally. Nose:  External nasal examination shows no deformity or inflammation. Nasal mucosa are pink and moist without lesions or exudates. Mouth:  Oral mucosa and oropharynx without lesions or exudates.  Teeth in good repair. Neck:  no carotid bruit or thyromegaly no cervical or supraclavicular lymphadenopathy Full ROM neck Lungs:  Normal respiratory effort, chest expands symmetrically. Lungs are clear to auscultation, no crackles or wheezes. Heart:  Normal rate and regular rhythm. S1 and S2 normal without gallop, murmur, click, rub or other extra sounds. Pulses:  R and L posterior tibial pulses are full and equal bilaterally  Extremities:  no edema   Impression & Recommendations:  Problem # 1:  HEADACHE, SEVERE (ICD-784.0) Resolved..possibly secondary to HTN vs hormonal changes.  The following medications were removed from the medication list:    Hydrocodone-acetaminophen 5-500 Mg Tabs (Hydrocodone-acetaminophen) .Marland Kitchen... 1 tab by mouth poq6 hours as needed pain  Problem # 2:  HYPERTENSION (ICD-401.9) Improved control. Encouraged exercise, weight loss, healthy eating habits. Follow up in 3 months.  Her updated medication list for this problem includes:    Lisinopril-hydrochlorothiazide 10-12.5 Mg Tabs (Lisinopril-hydrochlorothiazide) .Marland Kitchen... 2 tab by mouth daily  Complete Medication List: 1)  Celexa 40 Mg Tabs (Citalopram hydrobromide) .... Take 1 tablet by mouth once a day 2)  Multivitamins Tabs (Multiple vitamin) .... Take 1 tablet by mouth once a day 3)  Caltrate 600+d 600-400 Mg-unit Tabs (Calcium carbonate-vitamin d) .... Take 1 tablet by mouth once a day 4)  Fish Oil Concentrate 1000 Mg Caps (Omega-3 fatty acids) .... Take 2  capsules  by mouth once daily 5)  Healthy Colon Caps (Probiotic product) .... Once daily 6)  Lisinopril-hydrochlorothiazide 10-12.5 Mg  Tabs (Lisinopril-hydrochlorothiazide) .... 2 tab by mouth daily  Patient Instructions: 1)  Please schedule a follow-up appointment in 3 months  HTN check.  Current Allergies (reviewed today): No known allergies     Appended Document: ROA FOR 1-2 WEEK HTN CHECK/JRR Flu Vaccine Consent Questions     Do you have a history of severe allergic reactions to this vaccine? no    Any prior history of allergic reactions to egg and/or gelatin? no    Do you have a sensitivity to the preservative Thimersol? no    Do you have a past history of Guillan-Barre Syndrome? no    Do you currently have an acute febrile illness? no    Have you ever had a severe reaction to latex? no    Vaccine information given and explained to patient? yes    Are you currently pregnant? no    Lot Number:[id number]   Exp Date:09/11/2010   Site Given  Left Deltoid IM    Clinical Lists Changes  Orders: Added new Service order of Admin 1st Vaccine ([id number]) - Signed Added new Service order of Flu Vaccine 27yrs + [phone]) - Signed Observations: Added new observation of FLU VAX VIS: 10/21/09 version (11/11/2009 9:25) Added new observation of FLU VAXLOT: [id number] (11/11/2009 9:25) Added new observation of FLU VAXMFR: [employer] (11/11/2009 9:25) Added new observation of FLU VAX EXP: 09/11/2010 (11/11/2009 9:25) Added new observation of FLU VAX DSE: 0.9ml (11/11/2009 9:25) Added new observation of  FLU VAX: Fluvax 3+ (11/11/2009 9:25)

## 2010-04-13 NOTE — Progress Notes (Signed)
Summary: some questions for Dr. Senaida Lange nurse  Phone Note Call from Patient Call back at Home Phone [phone]   Caller: Patient Call For: Kerby Nora MD Summary of Call: Patient has some questions that she would like to discuss w/ Dr. Daphine Deutscher nurse. She is asking that you please give her a call back at your convenience.  Initial call taken by: Melody Comas,  May 06, 2009 2:13 PM  Follow-up for Phone Call        Left message on cell phone voicemail for patient to return call.  Linde Gillis CMA [name])  May 06, 2009 2:34 PM   Pt asked if chest CT will show her heart, advised her yes but not in great detail.  She is concerned that some of her sxs could be heart related but I explained to her that Dr. Ermalene Searing had heard abnormal lung sounds so that is why they are checking her lungs. Follow-up by: Lowella Petties CMA,  May 06, 2009 3:03 PM  Additional Follow-up for Phone Call Additional follow up Details #1::        agree, chest CT sounds appropriate Additional Follow-up by: Hannah Beat MD,  May 06, 2009 3:22 PM

## 2010-04-13 NOTE — Letter (Signed)
Summary: Results Follow up Letter  [place] [facility]  [address] [place], [place] [address]   Phone: [phone]  Fax: [phone]    04/07/2009 MRN: [id number]     [facility] [address] [place], [place]  [address]    Dear Ms. Uhls,  The following are the results of your recent test(s):  Test         Result    Pap Smear:        Normal __x___  Not Normal _____ Comments: ______________________________________________________ Cholesterol: LDL(Bad cholesterol):         Your goal is less than:         HDL (Good cholesterol):       Your goal is more than: Comments:  ______________________________________________________ Mammogram:        Normal _____  Not Normal _____ Comments:  ___________________________________________________________________ Hemoccult:        Normal _____  Not normal _______ Comments:    _____________________________________________________________________ Other Tests:    We routinely do not discuss normal results over the telephone.  If you desire a copy of the results, or you have any questions about this information we can discuss them at your next office visit.   Sincerely,   Kerby Nora MD

## 2010-04-16 ENCOUNTER — Telehealth (INDEPENDENT_AMBULATORY_CARE_PROVIDER_SITE_OTHER): Payer: Self-pay | Admitting: *Deleted

## 2010-04-20 ENCOUNTER — Other Ambulatory Visit (INDEPENDENT_AMBULATORY_CARE_PROVIDER_SITE_OTHER): Payer: BC Managed Care – HMO

## 2010-04-20 ENCOUNTER — Encounter (INDEPENDENT_AMBULATORY_CARE_PROVIDER_SITE_OTHER): Payer: Self-pay | Admitting: *Deleted

## 2010-04-20 ENCOUNTER — Other Ambulatory Visit: Payer: Self-pay | Admitting: Family Medicine

## 2010-04-20 DIAGNOSIS — I10 Essential (primary) hypertension: Secondary | ICD-10-CM

## 2010-04-20 LAB — BASIC METABOLIC PANEL
BUN: 18 mg/dL (ref 6–23)
Calcium: 9.4 mg/dL (ref 8.4–10.5)
Creatinine, Ser: 0.7 mg/dL (ref 0.4–1.2)

## 2010-04-20 LAB — LIPID PANEL
Cholesterol: 195 mg/dL (ref 0–200)
HDL: 80.9 mg/dL (ref >39.00)
VLDL: 26 mg/dL (ref 0.0–40.0)

## 2010-04-20 LAB — HEPATIC FUNCTION PANEL: Albumin: 4.2 g/dL (ref 3.5–5.2)

## 2010-04-21 NOTE — Progress Notes (Signed)
----   Converted from flag ---- ---- 04/16/2010 12:50 PM, Kerby Nora MD wrote: CMET, lipids Dx 401.1  ---- 04/15/2010 7:50 AM, Liane Comber CMA (AAMA) wrote: Lab orders please! Good Morning! This pt is scheduled for cpx labs Tuesday, which labs to draw and dx codes to use? Thanks Tasha ------------------------------

## 2010-04-27 ENCOUNTER — Encounter: Payer: Self-pay | Admitting: Family Medicine

## 2010-04-27 ENCOUNTER — Encounter (INDEPENDENT_AMBULATORY_CARE_PROVIDER_SITE_OTHER): Payer: BC Managed Care – HMO | Admitting: Family Medicine

## 2010-04-27 DIAGNOSIS — Z01419 Encounter for gynecological examination (general) (routine) without abnormal findings: Secondary | ICD-10-CM

## 2010-04-27 DIAGNOSIS — Z0000 Encounter for general adult medical examination without abnormal findings: Secondary | ICD-10-CM

## 2010-04-30 ENCOUNTER — Telehealth: Payer: Self-pay | Admitting: Family Medicine

## 2010-05-05 NOTE — Assessment & Plan Note (Signed)
Summary: CPX/CLE   Vital Signs:  Patient profile:   50 year old female Weight:      134.50 pounds BMI:     22.46 Temp:     98.4 degrees F oral Pulse rate:   72 / minute Pulse rhythm:   regular BP sitting:   118 / 72  (left arm) Cuff size:   regular  Vitals Entered By: Sydell Axon LPN (April 27, 2010 11:23 AM) CC: 30 Minute checkup with pap     Last PAP Date 04/27/2010 Last PAP Result DVE yearly,no pap this year, pap ever 2 years   History of Present Illness:  The patient is here for annual wellness exam and preventative care.     HTN, well controlled on no  meds. At home 130/70s, rarely >140/90 only after caffeine.  Depression, well controlled on celexa.   Reviewed DM and chol screen with pt.   Preventive Screening-Counseling & Management  Caffeine-Diet-Exercise     Diet Comments: moderate     Diet Counseling: to improve diet; diet is suboptimal     Does Patient Exercise: no     Exercise Counseling: to improve exercise regimen  Problems Prior to Update: 1)  Headache, Severe  (ICD-784.0) 2)  Nonspcifc Abn Finding Rad & Oth Exam Lung Field  (ICD-793.1) 3)  Cough  (ICD-786.2) 4)  Routine Gynecological Examination  (ICD-V72.31) 5)  Abdominal Pain, Right Upper Quadrant  (ICD-789.01) 6)  Family History Breast Cancer 1st Degree Relative <50  (ICD-V16.3) 7)  Other Screening Mammogram  (ICD-V76.12) 8)  Colitis, Collagenous  (ICD-558.9) 9)  Ovarian Cyst, Left  (ICD-620.2) 10)  Low Back Pain, Acute  (ICD-724.2) 11)  Abdominal Pain, Right Upper Quadrant  (ICD-789.01) 12)  Well Adult Exam  (ICD-V70.0) 13)  Depression  (ICD-311) 14)  Anxiety  (ICD-300.00) 15)  Hypertension  (ICD-401.9)  Current Medications (verified): 1)  Celexa 40 Mg  Tabs (Citalopram Hydrobromide) .... Take 1 Tablet By Mouth Once A Day 2)  Multivitamins   Tabs (Multiple Vitamin) .... Take 1 Tablet By Mouth Once A Day 3)  Caltrate 600+d 600-400 Mg-Unit  Tabs (Calcium Carbonate-Vitamin D) .... Take 1  Tablet By Mouth Once A Day 4)  Fish Oil Concentrate 1000 Mg  Caps (Omega-3 Fatty Acids) .... Take 2  Capsules  By Mouth Once Daily  Allergies (verified): No Known Drug Allergies  Past History:  Past medical, surgical, family and social histories (including risk factors) reviewed, and no changes noted (except as noted below).  Past Medical History: Reviewed history from 04/25/2007 and no changes required. Hypertension Anxiety Depression  Past Surgical History: Reviewed history from 04/25/2007 and no changes required. 2002 R foot surgery  2006 L knee surgery, torn ligiments Tonsillectomy breast reduction 1983  Family History: Reviewed history from 10/10/2008 and no changes required. father: healthy mother: HTN, CAD (MI at age 69), melanoma, osteoporosi MGM; breast cancer MGF: colon cancer sister: breast cancer brother CAD, CVA Family History Breast cancer 1st degree relative <50  Social History: Reviewed history from 04/25/2007 and no changes required. 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 CV:  Denies chest pain or discomfort. Resp:  Denies shortness of breath. GI:  Denies abdominal pain and bloody stools. GU:  Denies dysuria; last menses  02/27/2010.. somw night sweats, but minimal.. Psych:  Denies anxiety and depression.  Physical Exam  General:  Well-developed,well-nourished,in no acute distress; alert,appropriate and cooperative throughout examination Eyes:  No corneal or conjunctival inflammation noted. EOMI. Perrla. Funduscopic exam benign, without hemorrhages, exudates or papilledema. Vision grossly normal. Ears:  External ear exam shows no significant lesions or deformities.  Otoscopic  examination reveals clear canals, tympanic membranes are intact bilaterally without bulging, retraction, inflammation or discharge. Hearing is grossly normal bilaterally. Nose:  External nasal examination shows no deformity or inflammation. Nasal mucosa are pink and moist without lesions or exudates. Mouth:  Oral mucosa and oropharynx without lesions or exudates.  Teeth in good repair. Neck:  no carotid bruit or thyromegaly no cervical or supraclavicular lymphadenopathy  Chest Wall:  No deformities, masses, or tenderness noted. Breasts:  No mass, nodules, thickening, tenderness, bulging, retraction, inflamation, nipple discharge or skin changes noted.  Healed scars from reduction present Lungs:  Normal respiratory effort, chest expands symmetrically. Lungs are clear except chronic rub heard in left apices (neg CXR and neg CT chest), no crackles or wheezes. Heart:  Normal rate and regular rhythm. S1 and S2 normal without gallop, murmur, click, rub or other extra sounds. Abdomen:  Bowel sounds positive,abdomen soft and non-tender without masses, organomegaly or hernias noted. Genitalia:  normal introitus, no vaginal discharge, mucosa pink and moist, no vaginal or cervical lesions, no vaginal atrophy, normal uterus size and position, and no adnexal masses or tenderness.   Msk:  No deformity or scoliosis noted of thoracic or lumbar spine.   Pulses:  R and L posterior tibial pulses are full and equal bilaterally  Extremities:  no edema Skin:  Intact without suspicious lesions or rashes Psych:  Cognition and judgment appear intact. Alert and cooperative with normal attention span and concentration. No apparent delusions, illusions, hallucinations   Impression & Recommendations:  Problem # 1:  WELL ADULT EXAM (ICD-V70.0) The patient's preventative maintenance and recommended screening tests for an annual wellness exam were reviewed in full today. Brought up to date unless services  declined.  Counselled on the importance of diet, exercise, and its role in overall health and mortality. The patient's FH and SH was reviewed, including their home life, tobacco status, and drug and alcohol status.     Problem # 2:  Gynecological examination-routine (ICD-V72.31) This year DVE only .. pap every 2 years.   Problem # 3:  DEPRESSION (ICD-311) Well controlled. Continue current medication.  Her updated medication list for this problem includes:    Celexa 40 Mg Tabs (Citalopram hydrobromide) .Marland Kitchen... Take 1 tablet by mouth once a day  Problem # 4:  HYPERTENSION (ICD-401.9) Well controlled. Encouraged exercise, weight maintanance, healthy eating habits.  The following medications were removed from the medication list:    Lisinopril-hydrochlorothiazide 10-12.5 Mg Tabs (Lisinopril-hydrochlorothiazide) .Marland Kitchen... 2 tab by mouth daily  Complete Medication List: 1)  Celexa 40 Mg Tabs (Citalopram hydrobromide) .... Take 1 tablet by mouth once a day 2)  Multivitamins Tabs (Multiple vitamin) .... Take 1 tablet by mouth once a day 3)  Caltrate 600+d 600-400 Mg-unit Tabs (Calcium carbonate-vitamin d) .... Take 1 tablet by mouth once a day 4)  Fish Oil Concentrate 1000 Mg Caps (Omega-3 fatty acids) .... Take 2  capsules  by mouth once daily  Other Orders: Radiology Referral (Radiology)  Patient Instructions: 1)  Referral Appointment Information 2)  Day/Date: 3)  Time: 4)  Place/MD: 5)  Address: 6)  Phone/Fax: 7)  Patient given appointment  information. Information/Orders faxed/mailed.  8)  Please schedule a follow-up appointment in 1 year.    Orders Added: 1)  Radiology Referral [Radiology] 2)  Est. Patient 40-64 years [phone]    Current Allergies (reviewed today): No known allergies   Last PAP:  NEGATIVE FOR INTRAEPITHELIAL LESIONS OR MALIGNANCY. (04/03/2009 12:00:00 AM) PAP Result Date:  04/27/2010 PAP Result:  DVE yearly,no pap this year, pap ever 2 years PAP Next Due:  1  yr

## 2010-05-11 NOTE — Progress Notes (Signed)
Summary: Need blood pressure meds called in     Phone Note Call from Patient   Summary of Call: Pt called says her blood pressure is still up. Says she is still in denial, however feels she needs the blood pressure meds.  Pharmacy [facility] in [place]. Call back # [phone].Marland KitchenDaine Gip  April 30, 2010 12:51 PM    Initial call taken by: Daine Gip,  April 30, 2010 12:52 PM  Follow-up for Phone Call        Is she currently takeing the 10/12.5 lisinopril?  If so will simple increase this  LMOM for pt to call.         Lowella Petties CMA, AAMA  April 30, 2010 3:04 PM Spoke with pt,she has not been taking lisinopril for several months. She says her BP has been running high for the last couple of monts.  Advised her that medicine has been called in, she will pick it up and start taking again. Follow-up by: Lowella Petties CMA, AAMA,  April 30, 2010 5:40 PM    New/Updated Medications: LISINOPRIL-HYDROCHLOROTHIAZIDE 10-12.5 MG TABS (LISINOPRIL-HYDROCHLOROTHIAZIDE) Take 1 tablet by mouth once a day Prescriptions: LISINOPRIL-HYDROCHLOROTHIAZIDE 10-12.5 MG TABS (LISINOPRIL-HYDROCHLOROTHIAZIDE) Take 1 tablet by mouth once a day  #30 x 11   Entered and Authorized by:   Kerby Nora MD   Signed by:   Kerby Nora MD on 04/30/2010   Method used:   Electronically to        [facility] [id number]* (retail)       [address]       [place], [place]  [address]       Ph: [phone]       Fax:    RxID:   [id number] LISINOPRIL-HYDROCHLOROTHIAZIDE 10-12.5 MG TABS (LISINOPRIL-HYDROCHLOROTHIAZIDE) Take 1 tablet by mouth once a day  #30 x 11   Entered and Authorized by:   Kerby Nora MD   Signed by:   Kerby Nora MD on 04/30/2010   Method used:   Electronically to        [facility]  [address] (retail)       [address]       [place], [place]  [address]       Ph: [phone]       Fax: [phone]   RxID:   [id number]

## 2010-05-28 ENCOUNTER — Encounter: Payer: Self-pay | Admitting: Family Medicine

## 2010-05-28 ENCOUNTER — Ambulatory Visit: Payer: Self-pay | Admitting: Family Medicine

## 2010-05-29 ENCOUNTER — Encounter (INDEPENDENT_AMBULATORY_CARE_PROVIDER_SITE_OTHER): Payer: Self-pay | Admitting: *Deleted

## 2010-06-01 ENCOUNTER — Other Ambulatory Visit: Payer: Self-pay | Admitting: Family Medicine

## 2010-06-01 NOTE — Letter (Signed)
Summary: Results Follow up Letter  [place] [facility]  [address] [place], [place] [address]   Phone: [phone]  Fax: [phone]    05/29/2010 MRN: [id number]     [facility] [address] [place], [place]  [address]  [place]     Dear Ms. Alia,  The following are the results of your recent test(s):  Test         Result    Pap Smear:        Normal _____  Not Normal _____ Comments: ______________________________________________________ Cholesterol: LDL(Bad cholesterol):         Your goal is less than:         HDL (Good cholesterol):       Your goal is more than: Comments:  ______________________________________________________ Mammogram:        Normal __x___  Not Normal _____ Comments:Repeat in 1 year  ___________________________________________________________________ Hemoccult:        Normal _____  Not normal _______ Comments:    _____________________________________________________________________ Other Tests:    We routinely do not discuss normal results over the telephone.  If you desire a copy of the results, or you have any questions about this information we can discuss them at your next office visit.   Sincerely,   Kerby Nora MD

## 2010-06-02 ENCOUNTER — Other Ambulatory Visit: Payer: Self-pay | Admitting: *Deleted

## 2010-06-02 NOTE — Telephone Encounter (Signed)
Refill request from [facility] [address] [phone]

## 2010-06-02 NOTE — Telephone Encounter (Signed)
Medication already refilled today

## 2010-07-27 NOTE — Assessment & Plan Note (Signed)
NAME:  Bridget Reeves, Bridget Reeves NO.:  [id number]   MEDICAL RECORD NO.:  [id number]          PATIENT TYPE:  POB   LOCATION:  [facility]         FACILITY:  [facility]   PHYSICIAN:  Matt Holmes, N.P.       DATE OF BIRTH:  [birth year]   DATE OF SERVICE:                                  CLINIC NOTE   Bridget Reeves is a 50 year old white female gravida 4, AB 2, para 2 who  presents for her annual exam and Pap smear.   PAST MEDICAL HISTORY:  Significant for anxiety which is treated.   FAMILY HISTORY:  Cardiac disease, hypertension, breast and colon cancer.  She has a first degree relative with breast cancer.   SOCIAL HISTORY:  She is recently married and very happy.  She uses  alcohol occasionally and denies tobacco and drug use.   GYN HISTORY:  The patient is a gravida 4, AB 2, para 2.  Her children  are 28 and 59 years old.  Menarche at age 38.  She states her cycles come  monthly lasting approximately 2 days. The bleeding is quite heavy and  dark with clots  and then after the second day it is completely gone.  She had bilateral tubal ligation in April 2008 for contraception.  She  has a remote history of abnormal Pap smears.  Her mammogram is now due.   SURGICAL HISTORY:  1. Bilateral tubal ligation on April 2008.  2. Breast reduction.  3. Right foot surgery.  4. Cesarean section.   REVIEW OF SYSTEMS:  She states that she has always had some bladder  leaking since high school. She uses pads most days but occasional  external genitalia irritation caused by the pads leaves her without any  on some days. She is not interested in treatment at this point.  She has  some occasional mild abdominal pain.   CURRENT MEDICATIONS:  Lexapro 10 mg daily for anxiety. She needs a  refill.   PHYSICAL EXAM:  VITAL SIGNS:  Pulse 68, blood pressure 136/83, weight  131, height 5 feet 5.  She states she has gained approximately 10 pounds  since she has been married.  HEENT:  Within  normal limits and without thyroid enlargement.  BREASTS:  Bilaterally soft without masses, nodes or nipple discharge.  LUNGS:  Clear.  HEART:  Rate and rhythm are regular without murmur, gallop or cardiac  enlargement.  ABDOMEN:  Soft and scaphoid without tenderness on exam today. There is  no rebound or guarding especially on the right side where she has felt  some discomfort.  PELVIC:  Within normal limits for female.  Vagina is clean and rugose.  Cervix is parous and clean. The uterus is nontender.  Normal shape, size  and contour.  Adnexa bilaterally clear without palpable masses or  tenderness.  Rectovaginal exam deferred.  EXTREMITIES:  Within normal limits.   ASSESSMENT:  1. Normal GYN exam.  2. Anxiety treated.  3. Intermittent right mid abdominal pain without findings on exam      today.   PLAN:  1. Pap smear which was taken today.  2. Mammogram is now due. The patient will  have report sent to our      office.  3. Bone density and colonoscopy screening should begin at age 20.  4. Prescription written for Lexapro 10 mg #90 one p.o. daily for mail      order. She is to return to the office in 1 year for her annual      exam.           ______________________________  Matt Holmes, N.P.     EMK/MEDQ  D:  12/26/2006  T:  12/27/2006  Job:  [id number]

## 2010-07-30 NOTE — Op Note (Signed)
NAME:  Bridget Reeves, Bridget Reeves NO.:  [id number]   MEDICAL RECORD NO.:  [id number]          PATIENT TYPE:  AMB   LOCATION:  [facility]                           FACILITY:  [facility]   PHYSICIAN:  Tracy L. Mayford Knife, M.D.DATE OF BIRTH:  04-May-1960   DATE OF PROCEDURE:  06/16/2006  DATE OF DISCHARGE:                               OPERATIVE REPORT   PREOPERATIVE DIAGNOSIS:  Undesired fertility.   POSTOPERATIVE DIAGNOSIS:  Undesired fertility.   PROCEDURE:  Laparoscopic bilateral tubal ligation with Filshie clips.   SURGEON:  Shelbie Proctor. Shawnie Pons, M.D.   ASSISTANT:  Marc Morgans. Mayford Knife, M.D.   ANESTHESIA:  General and local using Marcaine.   ESTIMATED BLOOD LOSS:  Minimal.   COMPLICATIONS:  None.   FINDINGS:  Normal uterus, tubes and ovaries.   SPECIMENS:  None.   PROCEDURE:  The patient was taken to the operating room where general  anesthesia was obtained and found to be adequate. The umbilicus was  injected with 0.5 mL of Marcaine. Knife was used to enter the umbilicus.  Peritoneal cavity was entered sharply. Vicryl suture placed through the  fascia. A 10-mm Hasson trocar placed through the incision. Peritoneal  cavity inflated. Left fallopian tube visualized and followed out to the  fimbriae. Filshie clip placed at the isthmic portion. Next, the right  fallopian tube was visualized and followed out to the fimbriae. Filshie  clip was placed in the isthmic region. Adequate hemostasis was  maintained. Camera and trocar were removed. Fascia closed with Vicryl.  Skin closed with Vicryl. The patient tolerated the procedure well. There  were no complications.           ______________________________  Marc Morgans Mayford Knife, M.D.     TLW/MEDQ  D:  06/16/2006  T:  06/16/2006  Job:  [id number]

## 2010-07-30 NOTE — Assessment & Plan Note (Signed)
NAME:  Bridget Reeves, Bridget Reeves NO.:  [id number]   MEDICAL RECORD NO.:  [id number]          PATIENT TYPE:  POB   LOCATION:  [facility]         FACILITY:  [facility]   PHYSICIAN:  Tinnie Gens, MD        DATE OF BIRTH:  [birth year]   DATE OF SERVICE:  05/02/2006                                  CLINIC NOTE   CHIEF COMPLAINT:  Followup.   HISTORY OF PRESENT ILLNESS:  The patient is a 50 year old gravida 4,  para 2-0-2-2, who has had one previous C-section for nonreassuring fetal  heart rate tracing during her last pregnancy who is getting married  again in April and would like to have some sort of definitive birth  control between now and then. The patient desires workup also for  abnormal bleeding. She __________ a week ago, who ordered a pelvic  sonogram, results of this are available and reviewed today. They show a  normal-appearing uterus and no evidence of fibroids and a 2.3 cm right  complex cyst, likely hemorrhagic. The patient reports some history of  fibroids that were removed by Dr. Ambrose Mantle through hysteroscope or D&C.  The patient reports her cycles are irregular in nature and that her  bleeding is prolonged. She has severe painful periods as well.   PAST MEDICAL HISTORY:  Negative.   PAST SURGICAL HISTORY:  1. She has had a breast reduction.  2. Right foot surgery.  3. C-section.   MEDICATIONS:  1. Lexapro 10 mg daily.  2. Glucosamine and chondroitin daily.  3. Multivitamin daily.   ALLERGIES:  None.   OB HISTORY:  G4, P2, one vaginal delivery and one C-section.   GYN HISTORY:  Menarche was age 80. Cycles are monthly, come every 23 to  26 days, last approximately 3 days with medium flow and severe pain.  Currently using condoms for birth control. Does have a history of an  abnormal pap in 1988 or 1989. Her last mammogram was August of 2007 and  normal.   FAMILY HISTORY:  Significant for heart attacks, hypertension, and breast  and colon cancer.  She had a first-degree relative with breast cancer.   SOCIAL HISTORY:  She is divorced and lives with her daughter and son and  is getting remarried in approximately two months. No tobacco or drug  use. She does drink approximately 4 to 6 alcoholic beverages weekly.   REVIEW OF SYSTEMS:  14-point review of systems reviewed. Positive,  please GYN history on the chart. Positive for fever, fatigue, loss of  urine with coughing or sneezing.   PHYSICAL EXAMINATION:  VITAL SIGNS: Are as noted in the chart. Blood  pressure is 162/87 last week and  142/83 today. She is a tanned female  in no acute distress.  ABDOMEN: Soft and nontender.  GE: Normal external female, genitalia and BUS is normal. Vagina is pink  and rugated. Cervix is parous without lesions.   PROCEDURE:  The anterior ellipse of the cervix  is grasped with a single-  tooth tenaculum and an attempt was made to insert a pipelle. A pipelle  could not be inserted. Attempt was made to change to a uterine sound.  The sound could not be inserted past the cervical os. A small dilator  was then used to attempt to enter the uterine cavity. However, the  cervix was stenotic and the patient was in a lot of pain and this  attempt was aborted. The uterus was very small, anteverted without mass  or tenderness.   IMPRESSION:  1. Abnormal bleeding.  2. Pelvic pain.   PLAN:  1. To complete workup for abnormal bleeding should get TSH, FSH as      well as endometrial sample. Since endometrial sample could not be      obtained today, would schedule her back at a time when she is      bleeding to hopefully improve our odds of getting in.  2. Check FSH, TSH today.  3. Trial of Provera 5 mg __________ with her cycles to see if this      controls her bleeding. The patient is not a candidate for oral      contraceptives secondary to her blood pressure restraints.           ______________________________  Tinnie Gens, MD     TP/MEDQ  D:   05/02/2006  T:  05/02/2006  Job:  [id number]

## 2010-07-30 NOTE — Assessment & Plan Note (Signed)
NAME:  Bridget Reeves, Bridget Reeves NO.:  [id number]   MEDICAL RECORD NO.:  [id number]          PATIENT TYPE:  POB   LOCATION:  [facility]         FACILITY:  [facility]   PHYSICIAN:  Tinnie Gens, MD        DATE OF BIRTH:  [birth year]   DATE OF SERVICE:                                  CLINIC NOTE   CHIEF COMPLAINT:  Undesired fertility.   HISTORY OF PRESENT ILLNESS:  Patient is a 50 year old female who really  desired a tubal ligation, who has had some abnormal bleeding. She has  had a pelvic sonogram __________ endometrial sampling however, her  bleeding is minimal at this time. She is getting married in April and  desires a tubal ligation prior to her wedding taking place.   PHYSICAL EXAMINATION:  Her vitals are normal. The patient is well-  nourished female in no acute distress. ABDOMEN: Soft nontender,  nondistended.   IMPRESSION:  Undesired fertility.   PLAN:  1. Laparoscopic BTL. We will try to get this scheduled in the next      week or two for the patient.  2. Additionally the patient is considering endometrial oblation at      some point later for her abnormal bleeding, still recommend      endometrial sampling if possible.           ______________________________  Tinnie Gens, MD     TP/MEDQ  D:  05/16/2006  T:  05/16/2006  Job:  [id number]

## 2010-08-07 IMAGING — CR DG CHEST 2V
2 series · 2 of 2 positions shown · non-contrast
Comparison: None

CLINICAL DATA: Cough for 1 month

CHEST - 2 VIEW

[w chest pa]
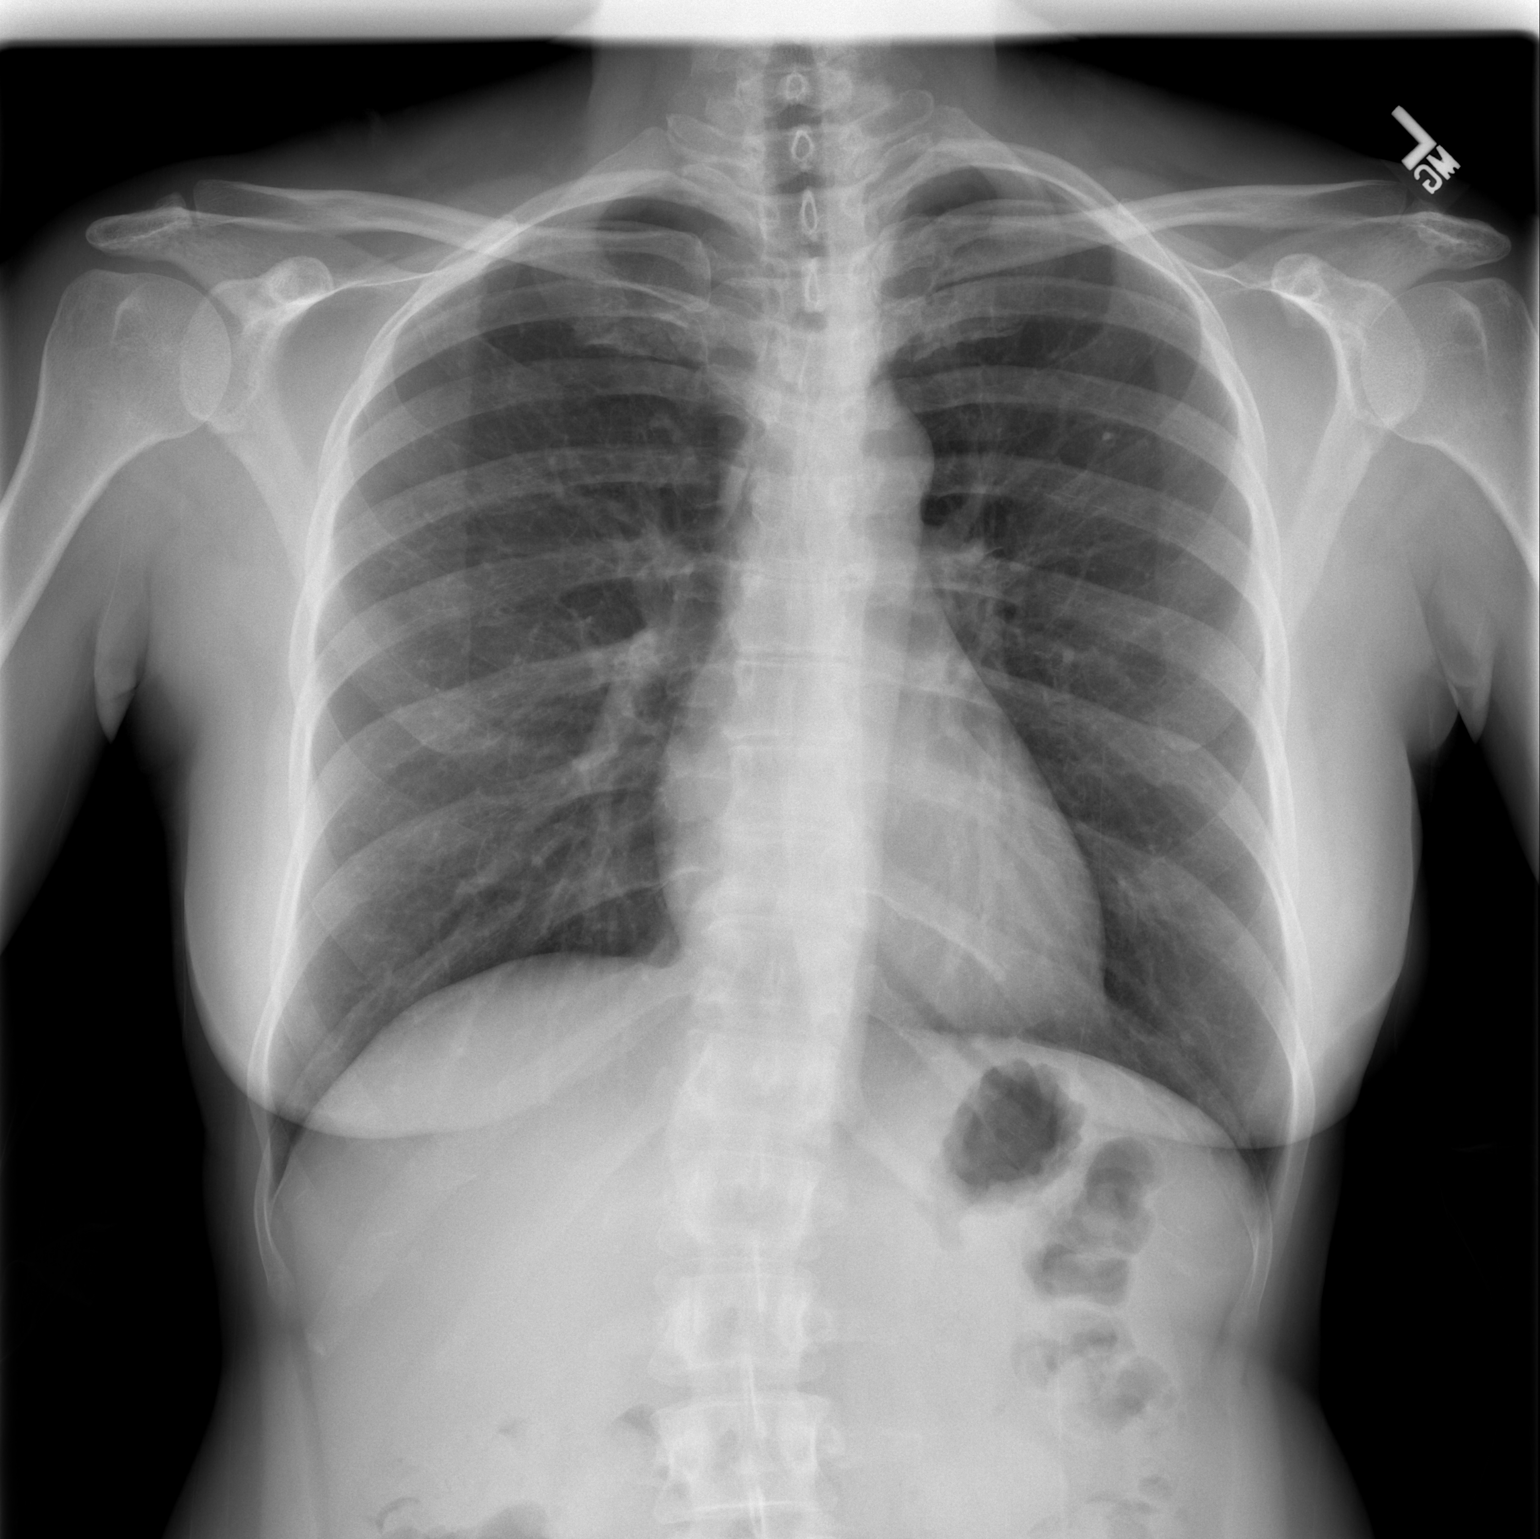

[w chest lat]
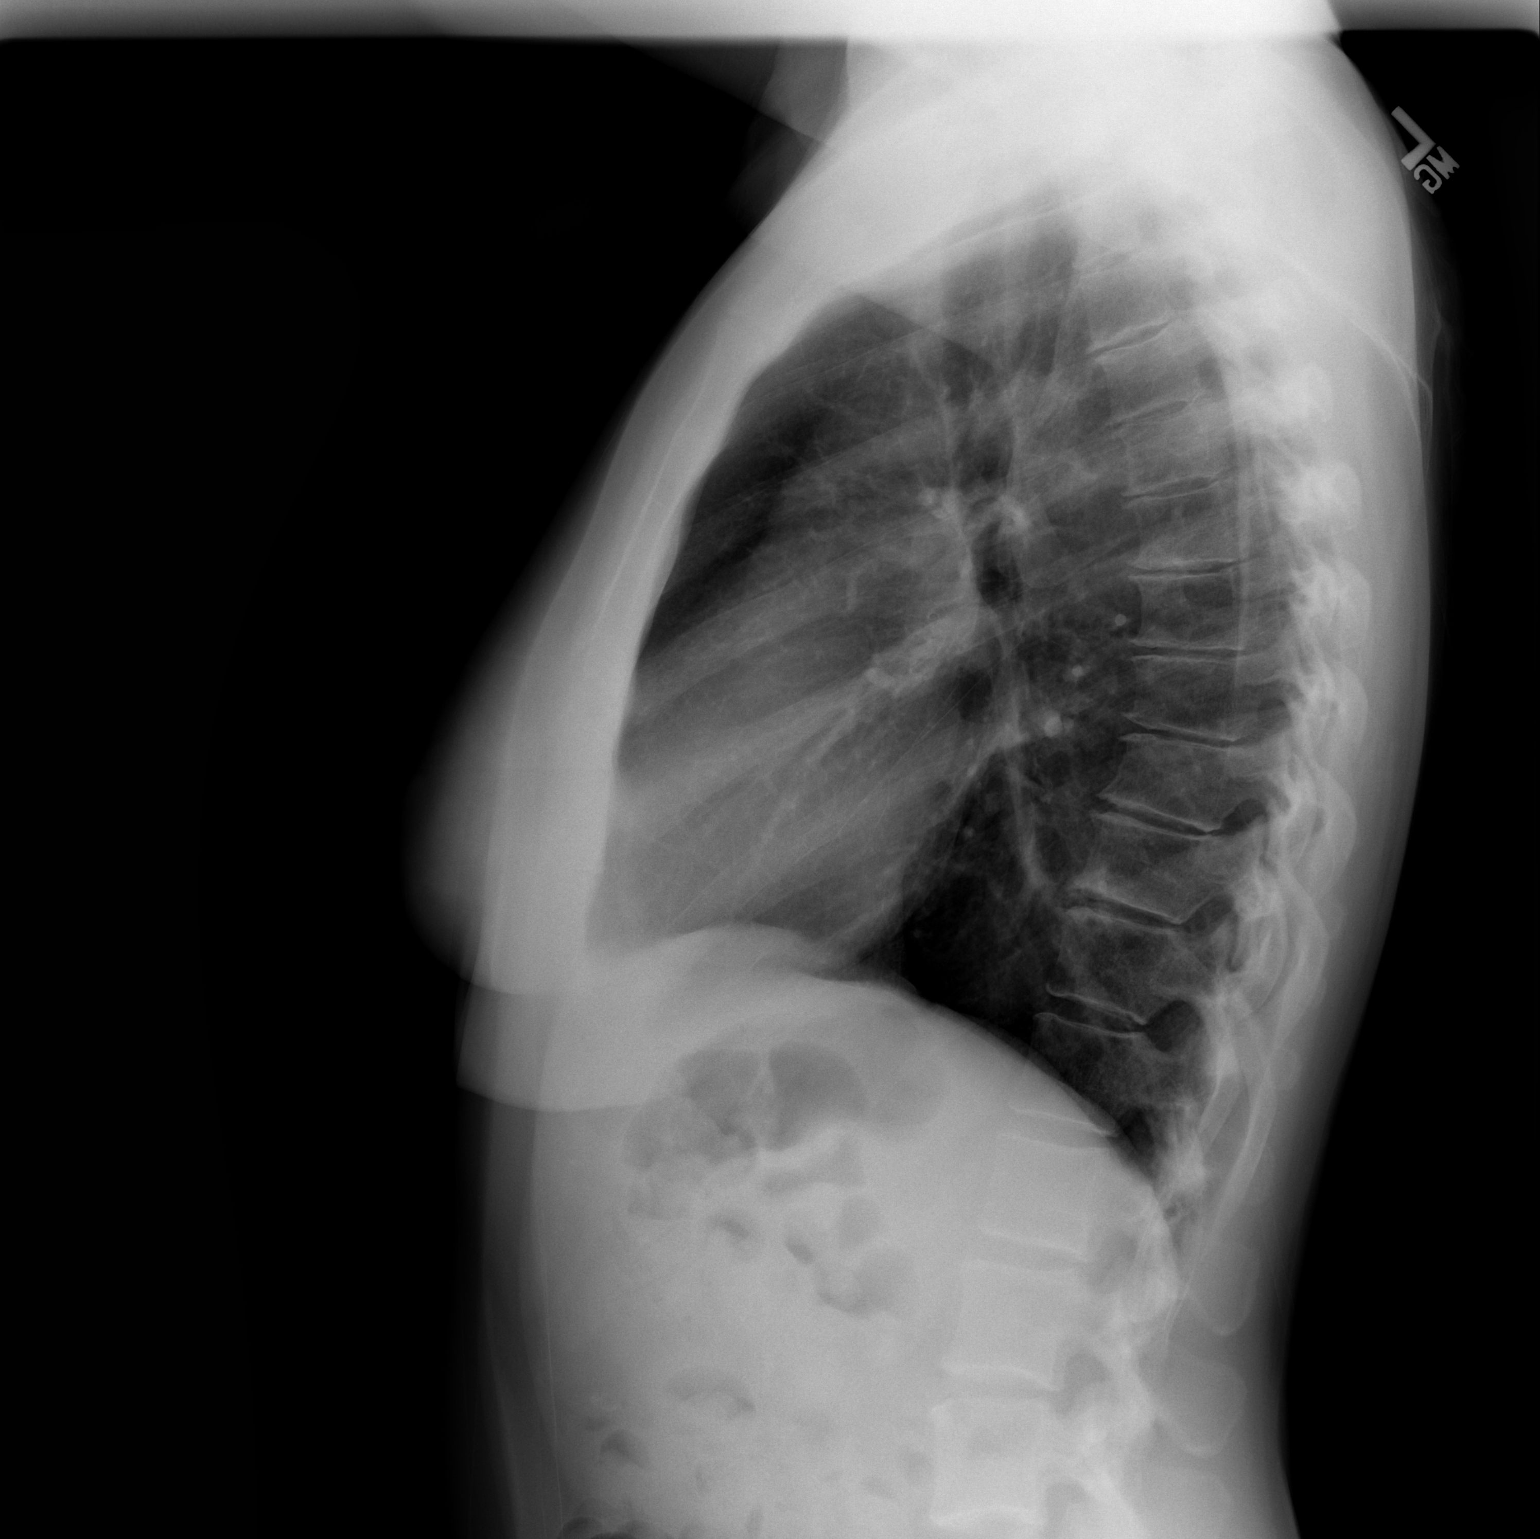

[2 of 2 positions shown; findings below may reference images not displayed]

FINDINGS: No active infiltrate or effusion is seen.  There is mild
peribronchial thickening which may indicate bronchitis.  The heart
is within normal limits in size.  Mediastinal contours are normal.
No bony abnormality is seen.
IMPRESSION: No pneumonia.  Minimal peribronchial thickening may indicate
bronchitis.

## 2011-02-28 IMAGING — CT CT HEAD W/O CM
2 series · 16 of 30 positions shown, 20 images · non-contrast
Comparison: None available.

CLINICAL DATA: Severe headache.

CT HEAD WITHOUT CONTRAST
TECHNIQUE: Contiguous axial images were obtained from the base of
the skull through the vertex without contrast

[Series 2: head w/o · axial · non-contrast · 0.49mm/px · z∈[+13,+137]mm · 13 of 28 slices shown, 17 images]
[im 2/28  brain]
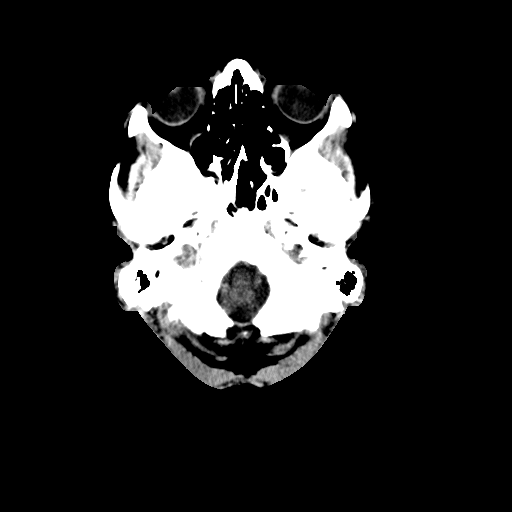
[im 2/28  bone]
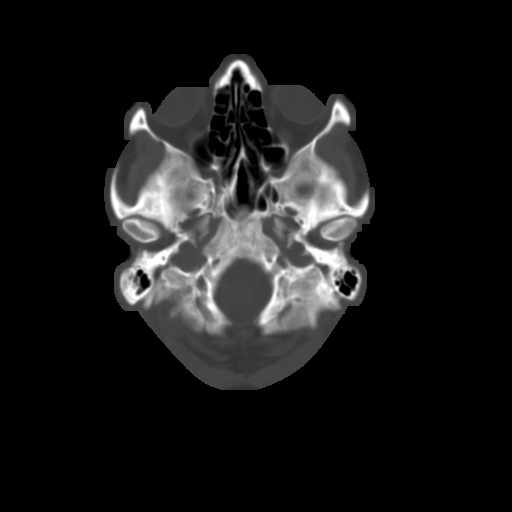
[im 4/28  brain]
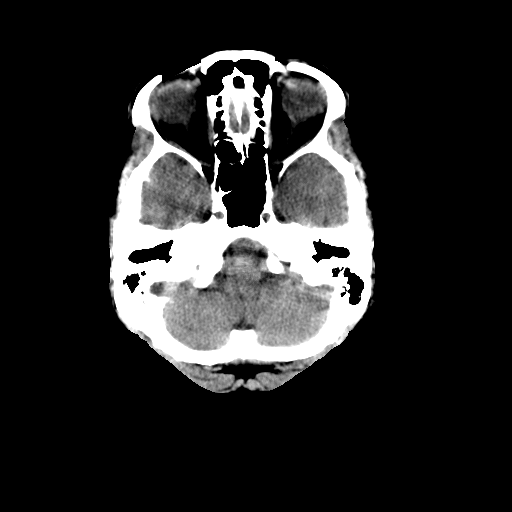
[im 6/28  brain]
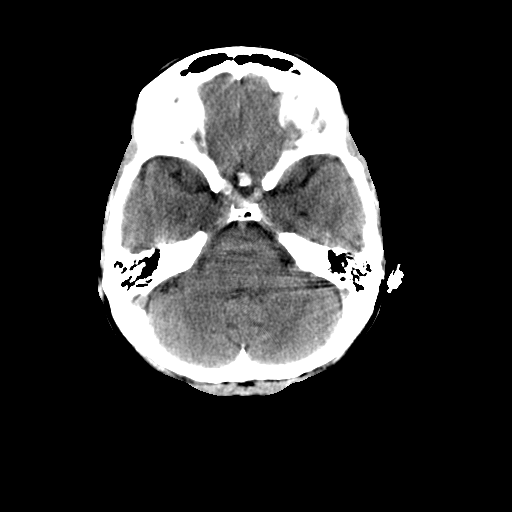
[im 8/28  brain]
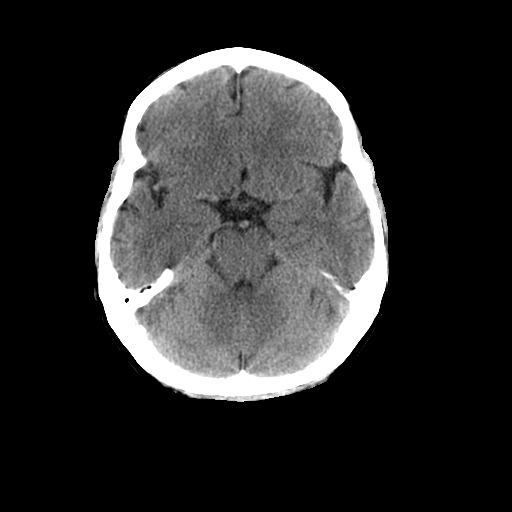
[im 10/28  brain]
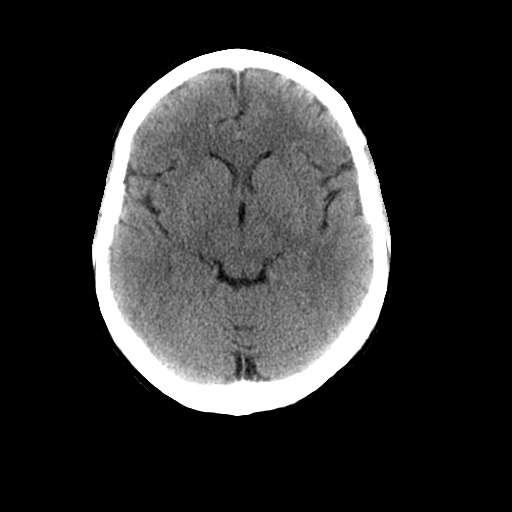
[im 10/28  bone]
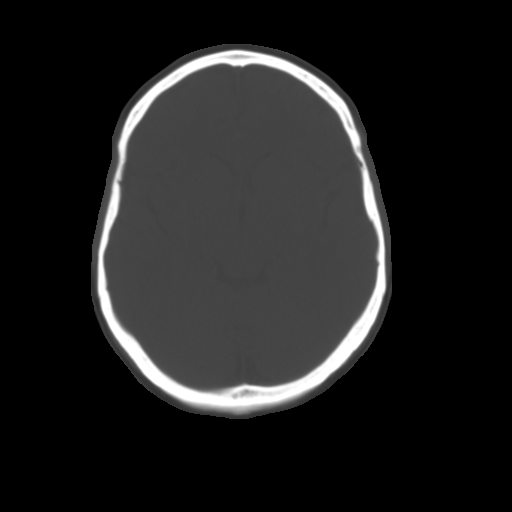
[im 12/28  brain]
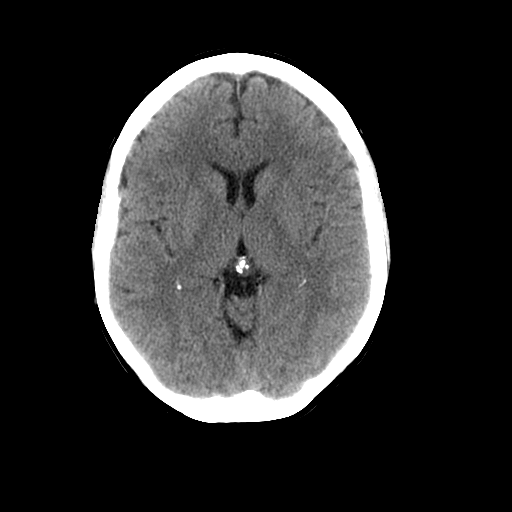
[im 14/28  brain]
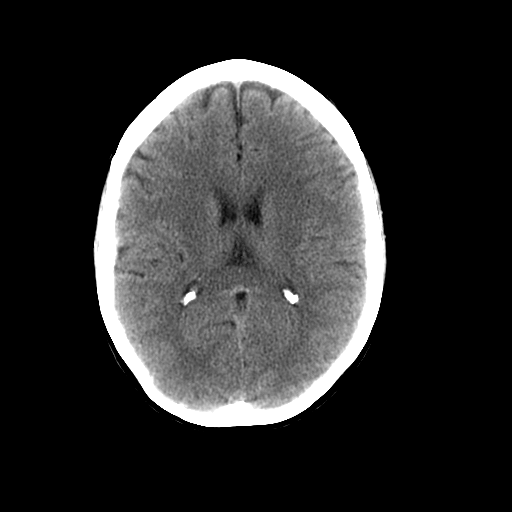
[im 16/28  brain]
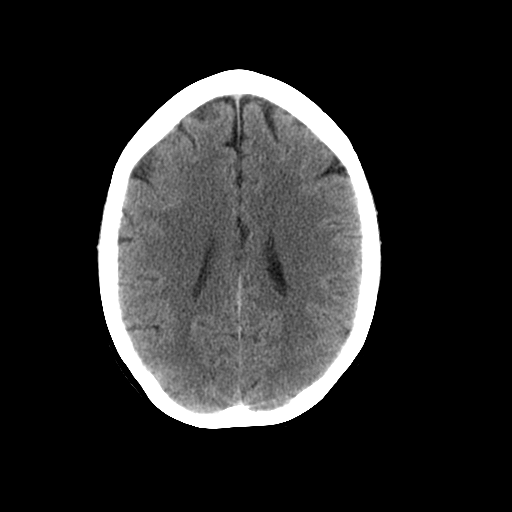
[im 18/28  brain]
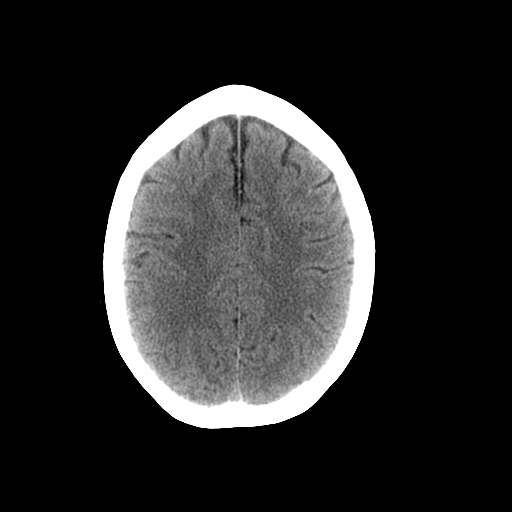
[im 18/28  bone]
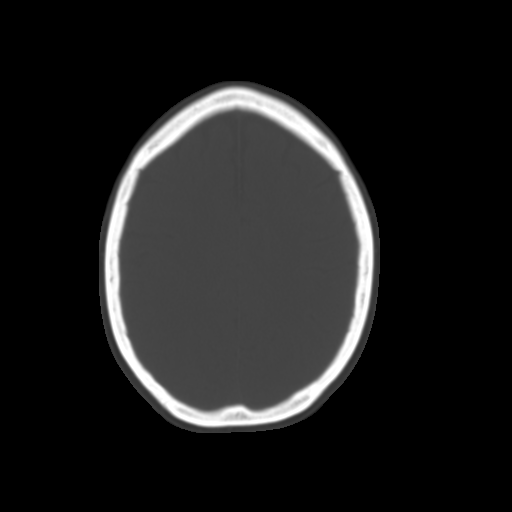
[im 20/28  brain]
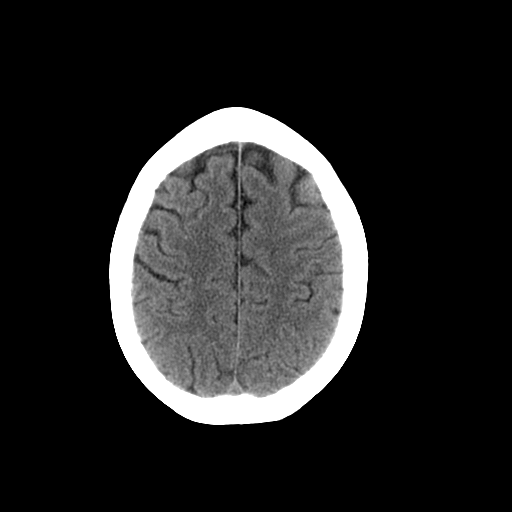
[im 22/28  brain]
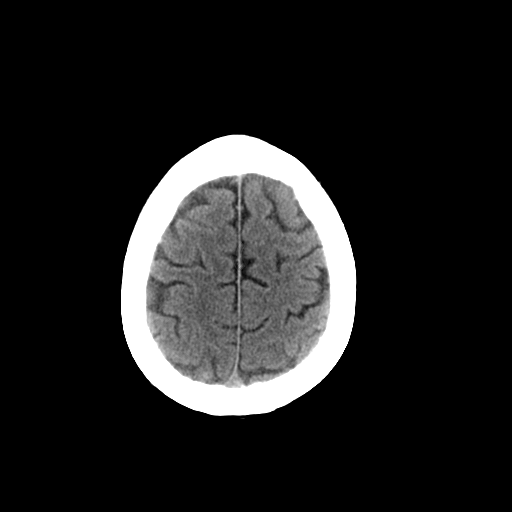
[im 24/28  brain]
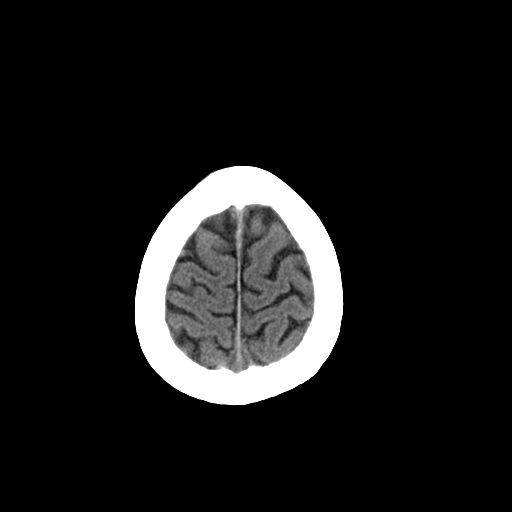
[im 26/28  brain]
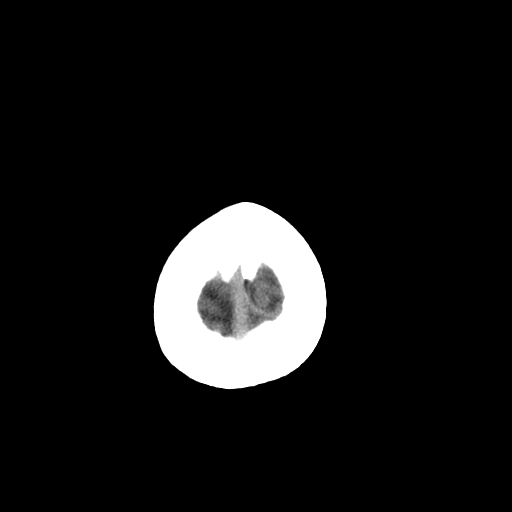
[im 26/28  bone]
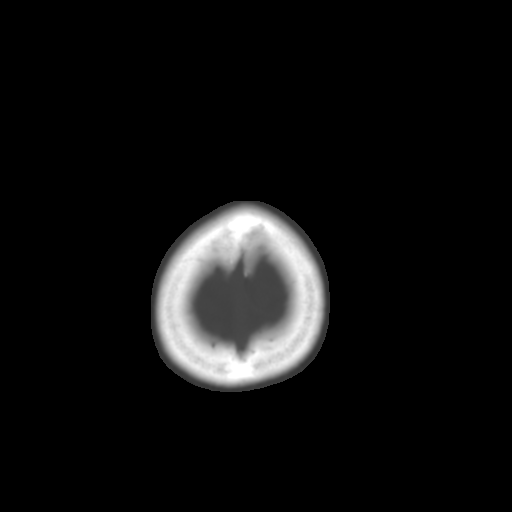

[Series 3: head bone · axial · 0.49mm/px · z∈[+13,+54]mm · 3 of 28 slices shown]
[im 2/28  bone]
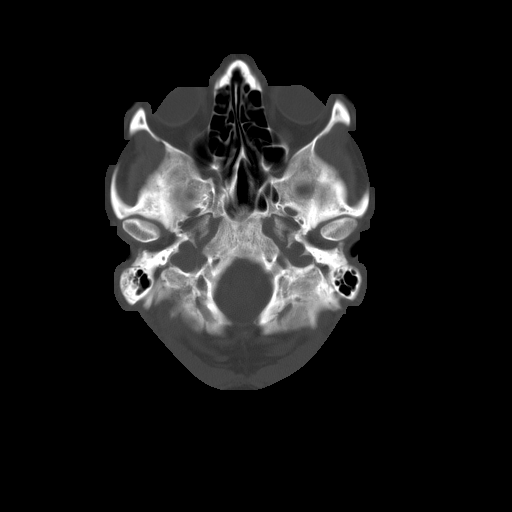
[im 6/28  bone]
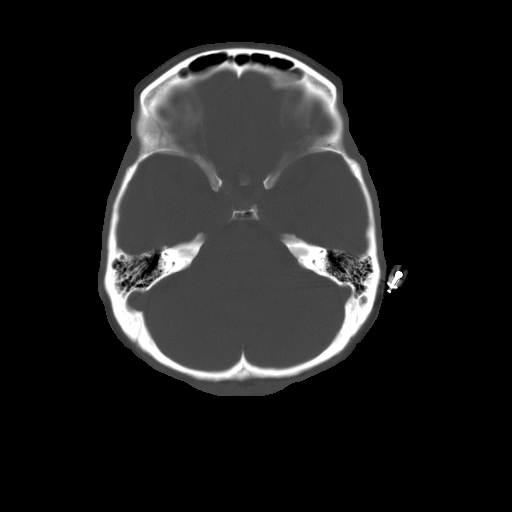
[im 10/28  bone]
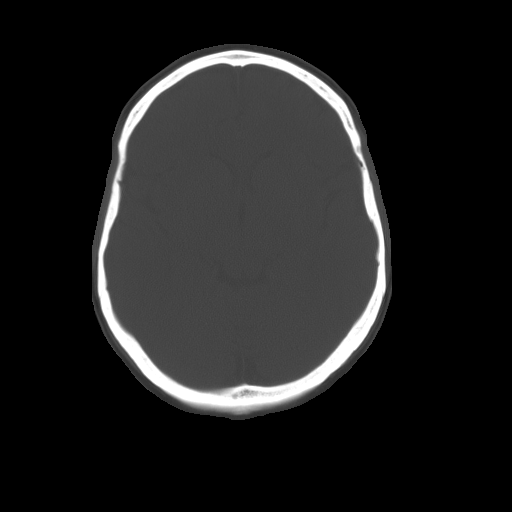

[16 of 30 positions shown; findings below may reference images not displayed]

FINDINGS: The brain has a normal appearance without evidence for
hemorrhage, acute infarction, hydrocephalus, or mass lesion.  There
is no extra axial fluid collection.  The skull and paranasal
sinuses are normal.
IMPRESSION: Normal CT of the head without contrast.

## 2011-03-09 ENCOUNTER — Ambulatory Visit (INDEPENDENT_AMBULATORY_CARE_PROVIDER_SITE_OTHER): Payer: BC Managed Care – HMO | Admitting: Family Medicine

## 2011-03-09 ENCOUNTER — Encounter: Payer: Self-pay | Admitting: Family Medicine

## 2011-03-09 VITALS — BP 120/72 | HR 72 | Temp 98.1°F | Ht 65.0 in | Wt 133.4 lb

## 2011-03-09 DIAGNOSIS — D649 Anemia, unspecified: Secondary | ICD-10-CM

## 2011-03-09 DIAGNOSIS — N951 Menopausal and female climacteric states: Secondary | ICD-10-CM

## 2011-03-09 DIAGNOSIS — Z23 Encounter for immunization: Secondary | ICD-10-CM

## 2011-03-09 LAB — CBC WITH DIFFERENTIAL/PLATELET
HCT: 33.4 % — ABNORMAL LOW (ref 36.0–46.0)
Hemoglobin: 11.4 g/dL — ABNORMAL LOW (ref 12.0–15.0)
Lymphs Abs: 1.7 10*3/uL (ref 0.7–4.0)
MCHC: 34.2 g/dL (ref 30.0–36.0)
MCV: 94.1 fl (ref 78.0–100.0)
Neutro Abs: 3.9 10*3/uL (ref 1.4–7.7)
Platelets: 181 10*3/uL (ref 150.0–400.0)
RDW: 13.2 % (ref 11.5–14.6)

## 2011-03-09 LAB — IBC PANEL
Iron: 116 ug/dL (ref 42–145)
Transferrin: 278.5 mg/dL (ref 212.0–360.0)

## 2011-03-09 MED ORDER — ESTROGENS, CONJUGATED 0.625 MG/GM VA CREA: TOPICAL_CREAM | Freq: Every day | VAGINAL | Status: AC

## 2011-03-09 NOTE — Progress Notes (Signed)
  Patient Name: Bridget Reeves Date of Birth: December 20, 1960 Age: 50 y.o. Medical Record Number: [id number] Gender: female Date of Encounter: 03/09/2011  History of Present Illness:  Bridget Reeves is a 50 y.o. very pleasant female patient who presents with the following:  Blood count low: the patient attempted to give blood at the [facility], and she was turned down. They told her that her blood count was low and her iron levels were low, and she needed to followup with her doctor. She is here today in followup regarding this. She has been having some occasional headaches, and she also is occasionally been lightheaded.  Occ headaches in the evenings. Late afternoon. Does not take any pills.  No LMP since 2 months ago.  Additionally, the patient complains of increased vaginal dryness recently. She does think that she has ongoing through menopause right now, and her periods have become more irregular. She is having some occasional dryness and more uncomfortable with sex b/c and wonders about potential remedies  Past Medical History, Surgical History, Social History, Family History, Problem List, Medications, and Allergies have been reviewed and updated if relevant.  Review of Systems:  GEN: No acute illnesses, no fevers, chills. GI: No n/v/d, eating normally Pulm: No SOB Interactive and getting along well at home.  Otherwise, ROS is as per the HPI.   Physical Examination: Filed Vitals:   03/09/11 0951  BP: 120/72  Pulse: 72  Temp: 98.1 F (36.7 C)  TempSrc: Oral  Height: 5\' 5"  (1.651 m)  Weight: 133 lb 6.4 oz (60.51 kg)  SpO2: 100%    Body mass index is 22.20 kg/(m^2).   GEN: WDWN, NAD, Non-toxic, A & O x 3 HEENT: Atraumatic, Normocephalic. Neck supple. No masses, No LAD. Ears and Nose: No external deformity. CV: RRR, No M/G/R. No JVD. No thrill. No extra heart sounds. PULM: CTA B, no wheezes, crackles, rhonchi. No retractions. No resp. distress. No accessory  muscle use. EXTR: No c/c/e NEURO Normal gait.  PSYCH: Normally interactive. Conversant. Not depressed or anxious appearing.  Calm demeanor.   Assessment and Plan: 1. Anemia  CBC with Differential, Ferritin, IBC panel  2. Flu vaccine need  Flu vaccine greater than or equal to 3yo preservative free IM   Vaginal dryness  Assessment anemia.  Her vaginal dryness, will do a trial of Premarin cream, pulse for one week, then initiate once weekly dosing. If she is not getting a good enough result, she can increase this to up to 3 times weekly.

## 2011-04-02 ENCOUNTER — Other Ambulatory Visit: Payer: Self-pay | Admitting: Family Medicine

## 2011-04-11 ENCOUNTER — Other Ambulatory Visit: Payer: Self-pay | Admitting: Gastroenterology

## 2011-04-14 LAB — STOOL CULTURE

## 2011-05-17 ENCOUNTER — Telehealth: Payer: Self-pay | Admitting: Family Medicine

## 2011-05-17 DIAGNOSIS — Z1231 Encounter for screening mammogram for malignant neoplasm of breast: Secondary | ICD-10-CM

## 2011-05-17 NOTE — Telephone Encounter (Signed)
Pt is wanting her Mammo before her next appt in May. She was wondering if an order can be put in for her to have it at [facility].

## 2011-05-18 NOTE — Telephone Encounter (Signed)
Referral sent 

## 2011-07-06 ENCOUNTER — Other Ambulatory Visit: Payer: Self-pay | Admitting: Family Medicine

## 2011-07-26 ENCOUNTER — Ambulatory Visit: Payer: Self-pay | Admitting: Family Medicine

## 2011-07-26 ENCOUNTER — Encounter: Payer: Self-pay | Admitting: Family Medicine

## 2011-08-04 ENCOUNTER — Telehealth: Payer: Self-pay | Admitting: Family Medicine

## 2011-08-04 DIAGNOSIS — I10 Essential (primary) hypertension: Secondary | ICD-10-CM

## 2011-08-04 NOTE — Telephone Encounter (Signed)
Message copied by Excell Seltzer on Thu Aug 04, 2011  2:00 PM ------      Message from: Alvina Chou      Created: Wed Aug 03, 2011  9:44 AM      Regarding: labs for Fri 5-24       Patient is scheduled for CPX labs, please order future labs, Thanks , Camelia Eng

## 2011-08-05 ENCOUNTER — Other Ambulatory Visit: Payer: BC Managed Care – HMO

## 2011-08-12 ENCOUNTER — Encounter: Payer: BC Managed Care – HMO | Admitting: Family Medicine

## 2011-09-30 ENCOUNTER — Other Ambulatory Visit: Payer: Self-pay | Admitting: Family Medicine

## 2011-11-03 ENCOUNTER — Other Ambulatory Visit (INDEPENDENT_AMBULATORY_CARE_PROVIDER_SITE_OTHER): Payer: BC Managed Care – HMO

## 2011-11-03 DIAGNOSIS — I10 Essential (primary) hypertension: Secondary | ICD-10-CM

## 2011-11-03 LAB — COMPREHENSIVE METABOLIC PANEL
Alkaline Phosphatase: 57 U/L (ref 39–117)
BUN: 18 mg/dL (ref 6–23)
Glucose, Bld: 93 mg/dL (ref 70–99)
Total Bilirubin: 0.5 mg/dL (ref 0.3–1.2)

## 2011-11-03 LAB — LIPID PANEL
Cholesterol: 218 mg/dL — ABNORMAL HIGH (ref 0–200)
Total CHOL/HDL Ratio: 2
Triglycerides: 65 mg/dL (ref 0.0–149.0)
VLDL: 13 mg/dL (ref 0.0–40.0)

## 2011-11-10 ENCOUNTER — Other Ambulatory Visit (HOSPITAL_COMMUNITY)
Admission: RE | Admit: 2011-11-10 | Discharge: 2011-11-10 | Disposition: A | Discharge status: Home / Self Care | Payer: BC Managed Care – HMO | Source: Ambulatory Visit | Attending: Family Medicine | Admitting: Family Medicine

## 2011-11-10 ENCOUNTER — Ambulatory Visit (INDEPENDENT_AMBULATORY_CARE_PROVIDER_SITE_OTHER): Payer: BC Managed Care – HMO | Admitting: Family Medicine

## 2011-11-10 ENCOUNTER — Encounter: Payer: Self-pay | Admitting: Family Medicine

## 2011-11-10 VITALS — BP 136/78 | HR 75 | Temp 98.8°F | Ht 65.0 in | Wt 121.8 lb

## 2011-11-10 DIAGNOSIS — K52831 Collagenous colitis: Secondary | ICD-10-CM | POA: Insufficient documentation

## 2011-11-10 DIAGNOSIS — I10 Essential (primary) hypertension: Secondary | ICD-10-CM

## 2011-11-10 DIAGNOSIS — Z01419 Encounter for gynecological examination (general) (routine) without abnormal findings: Secondary | ICD-10-CM

## 2011-11-10 DIAGNOSIS — K5289 Other specified noninfective gastroenteritis and colitis: Secondary | ICD-10-CM

## 2011-11-10 DIAGNOSIS — Z1151 Encounter for screening for human papillomavirus (HPV): Secondary | ICD-10-CM | POA: Insufficient documentation

## 2011-11-10 DIAGNOSIS — F329 Major depressive disorder, single episode, unspecified: Secondary | ICD-10-CM

## 2011-11-10 DIAGNOSIS — Z0000 Encounter for general adult medical examination without abnormal findings: Secondary | ICD-10-CM

## 2011-11-10 DIAGNOSIS — F411 Generalized anxiety disorder: Secondary | ICD-10-CM

## 2011-11-10 NOTE — Assessment & Plan Note (Signed)
Well controlled. Continue current medication.  

## 2011-11-10 NOTE — Progress Notes (Signed)
Subjective:    Patient ID: Bridget Reeves, female    DOB: 11-06-1960, 51 y.o.   MRN: [id number]  HPI  The patient is here for annual wellness exam and preventative care.   Hypertension:  Well controlled on linisopril/HCTZ.  lightheadedness:  None Chest pain with exertion:None Edema:None Short of breath:None Average home BPs: 130s/60s Other issues:   Depression, well controlled on celexa.   Reviewed labs in detial including  DM and chol screen with pt.  Lab Results  Component Value Date   CHOL 218* 11/03/2011   HDL 107.70 11/03/2011   LDLCALC 88 04/20/2010   LDLDIRECT 91.9 11/03/2011   TRIG 65.0 11/03/2011   CHOLHDL 2 11/03/2011    Using premarin vaginal cream for dryness.. started earlier in year with Dr. Patsy Lager.  Much better control.    Seeing Dr. Barrington Ellison for abdominal pain. Has collagenous colitits, Entocort was not helping. Diarrhea, chronic Had gallbladder US yesterday, results pending.  Review of Systems  Constitutional: Negative for fever, fatigue and unexpected weight change.  HENT: Negative for ear pain, congestion, sore throat, sneezing, trouble swallowing and sinus pressure.   Eyes: Negative for pain and itching.  Respiratory: Negative for cough, shortness of breath and wheezing.   Cardiovascular: Negative for chest pain, palpitations and leg swelling.  Gastrointestinal: Positive for abdominal pain and diarrhea. Negative for nausea, constipation and blood in stool.  Genitourinary: Negative for dysuria, hematuria, vaginal discharge, difficulty urinating and menstrual problem.  Skin: Negative for rash.  Neurological: Negative for syncope, weakness, light-headedness, numbness and headaches.  Psychiatric/Behavioral: Negative for confusion and dysphoric mood. The patient is not nervous/anxious.        Objective:   Physical Exam  Constitutional: Vital signs are normal. She appears well-developed and well-nourished. She is cooperative.  Non-toxic  appearance. She does not appear ill. No distress.  HENT:  Head: Normocephalic.  Right Ear: Hearing, tympanic membrane, external ear and ear canal normal.  Left Ear: Hearing, tympanic membrane, external ear and ear canal normal.  Nose: Nose normal.  Eyes: Conjunctivae, EOM and lids are normal. Pupils are equal, round, and reactive to light. No foreign bodies found.  Neck: Trachea normal and normal range of motion. Neck supple. Carotid bruit is not present. No mass and no thyromegaly present.  Cardiovascular: Normal rate, regular rhythm, S1 normal, S2 normal, normal heart sounds and intact distal pulses.  Exam reveals no gallop.   No murmur heard. Pulmonary/Chest: Effort normal and breath sounds normal. No respiratory distress. She has no wheezes. She has no rhonchi. She has no rales.  Abdominal: Soft. Normal appearance and bowel sounds are normal. She exhibits no distension, no fluid wave, no abdominal bruit and no mass. There is no hepatosplenomegaly. There is no tenderness. There is no rebound, no guarding and no CVA tenderness. No hernia.  Genitourinary: Vagina normal and uterus normal. No breast swelling, tenderness, discharge or bleeding. Pelvic exam was performed with patient prone. There is no rash, tenderness or lesion on the right labia. There is no rash, tenderness or lesion on the left labia. Uterus is not enlarged and not tender. Cervix exhibits no motion tenderness, no discharge and no friability. Right adnexum displays no mass, no tenderness and no fullness. Left adnexum displays no mass, no tenderness and no fullness.       Pap performed.  Lymphadenopathy:    She has no cervical adenopathy.    She has no axillary adenopathy.  Neurological: She is alert. She has normal strength. No cranial  nerve deficit or sensory deficit.  Skin: Skin is warm, dry and intact. No rash noted.  Psychiatric: Her speech is normal and behavior is normal. Judgment normal. Her mood appears not anxious.  Cognition and memory are normal. She does not exhibit a depressed mood.          Assessment & Plan:  The patient's preventative maintenance and recommended screening tests for an annual wellness exam were reviewed in full today. Brought up to date unless services declined.  Counselled on the importance of diet, exercise, and its role in overall health and mortality. The patient's FH and SH was reviewed, including their home life, tobacco status, and drug and alcohol status.    Vaccines: Uptodate with Td Mammo: 07/27/2011 DEXA:Mother with osteoporosis, smoker, no past steroids, WILL schedule for 07/2012 next year with mammo. PAP/DVE: Due this year, if normal space to every 3 years. DVE yearly.  Last menses 05/2011  Colon:Colonoscopy 12/2007 collagenous colitis, followed at [facility]. Marijuana smoker, oc

## 2011-11-10 NOTE — Patient Instructions (Addendum)
When call to schedule mammogram, also ask for bone density. Continue working on healthy eating and exercise.

## 2011-11-18 ENCOUNTER — Encounter: Payer: Self-pay | Admitting: Family Medicine

## 2012-02-17 ENCOUNTER — Other Ambulatory Visit: Payer: Self-pay | Admitting: Family Medicine

## 2012-05-24 ENCOUNTER — Other Ambulatory Visit: Payer: Self-pay | Admitting: Family Medicine

## 2012-09-11 ENCOUNTER — Other Ambulatory Visit: Payer: Self-pay | Admitting: Family Medicine

## 2012-11-05 ENCOUNTER — Other Ambulatory Visit: Payer: BC Managed Care – HMO

## 2012-11-06 ENCOUNTER — Telehealth: Payer: Self-pay | Admitting: Family Medicine

## 2012-11-06 DIAGNOSIS — I10 Essential (primary) hypertension: Secondary | ICD-10-CM

## 2012-11-06 NOTE — Telephone Encounter (Signed)
Message copied by Excell Seltzer on Tue Nov 06, 2012  5:42 PM ------      Message from: Alvina Chou      Created: Tue Nov 06, 2012  4:16 PM      Regarding: lab orders for Wednesday, 8.27.14       Patient is scheduled for CPX labs, please order future labs, Thanks , Terri       ------

## 2012-11-07 ENCOUNTER — Telehealth: Payer: Self-pay

## 2012-11-07 ENCOUNTER — Other Ambulatory Visit: Payer: BC Managed Care – HMO

## 2012-11-07 DIAGNOSIS — E2839 Other primary ovarian failure: Secondary | ICD-10-CM

## 2012-11-07 NOTE — Telephone Encounter (Signed)
Pt request order for bone density test.Please advise. Pt has CPX scheduled 01/04/13.

## 2012-11-07 NOTE — Telephone Encounter (Signed)
Ordered at [facility]. Please verify though that she is postmenopausal and has some risk for osteoporosis like family history or hx of steroid use.

## 2012-11-08 NOTE — Telephone Encounter (Signed)
Patient notified order for bone density sent to [facility].  Verified patient is postmenopausal and her mother has osteoporosis.  Bridget Reeves

## 2012-11-09 ENCOUNTER — Encounter: Payer: BC Managed Care – HMO | Admitting: Family Medicine

## 2012-11-23 ENCOUNTER — Encounter: Payer: Self-pay | Admitting: Family Medicine

## 2012-11-23 ENCOUNTER — Ambulatory Visit: Payer: Self-pay | Admitting: Family Medicine

## 2012-11-28 ENCOUNTER — Encounter: Payer: Self-pay | Admitting: Family Medicine

## 2012-11-28 ENCOUNTER — Ambulatory Visit: Payer: Self-pay | Admitting: Family Medicine

## 2012-11-29 ENCOUNTER — Ambulatory Visit: Payer: Self-pay | Admitting: Family Medicine

## 2012-11-30 ENCOUNTER — Encounter: Payer: Self-pay | Admitting: Family Medicine

## 2012-11-30 ENCOUNTER — Encounter: Payer: Self-pay | Admitting: *Deleted

## 2012-12-20 ENCOUNTER — Other Ambulatory Visit: Payer: Self-pay | Admitting: Family Medicine

## 2012-12-20 NOTE — Telephone Encounter (Signed)
Last office visit 11/10/2011.  CPX scheduled for 01/04/2013.  Ok to refill?

## 2012-12-23 ENCOUNTER — Ambulatory Visit: Payer: Self-pay

## 2012-12-23 LAB — URINALYSIS, COMPLETE
Bilirubin,UR: NEGATIVE
Glucose,UR: NEGATIVE mg/dL (ref 0–75)
Ketone: NEGATIVE
Nitrite: NEGATIVE
Protein: NEGATIVE
Specific Gravity: 1.01 (ref 1.003–1.030)
Squamous Epithelial: NONE SEEN

## 2012-12-26 LAB — URINE CULTURE

## 2012-12-28 ENCOUNTER — Telehealth: Payer: Self-pay | Admitting: Family Medicine

## 2012-12-28 NOTE — Telephone Encounter (Signed)
Use labs from august please.

## 2012-12-28 NOTE — Telephone Encounter (Signed)
Message copied by Excell Seltzer on Fri Dec 28, 2012 11:45 PM ------      Message from: Alvina Chou      Created: Fri Dec 21, 2012  3:37 PM      Regarding: Lab orders for Monday, 10.20.14       Patient is scheduled for CPX labs, please order future labs, Thanks , Terri                  There are 2 orders from August, use those? ------

## 2012-12-31 ENCOUNTER — Other Ambulatory Visit (INDEPENDENT_AMBULATORY_CARE_PROVIDER_SITE_OTHER): Payer: BC Managed Care – PPO

## 2012-12-31 DIAGNOSIS — E2839 Other primary ovarian failure: Secondary | ICD-10-CM

## 2012-12-31 DIAGNOSIS — I10 Essential (primary) hypertension: Secondary | ICD-10-CM

## 2012-12-31 LAB — LIPID PANEL
Total CHOL/HDL Ratio: 2
Triglycerides: 94 mg/dL (ref 0.0–149.0)

## 2012-12-31 LAB — COMPREHENSIVE METABOLIC PANEL WITH GFR
ALT: 19 U/L (ref 0–35)
AST: 30 U/L (ref 0–37)
Albumin: 4.6 g/dL (ref 3.5–5.2)
Alkaline Phosphatase: 67 U/L (ref 39–117)
BUN: 17 mg/dL (ref 6–23)
CO2: 30 meq/L (ref 19–32)
Calcium: 9.9 mg/dL (ref 8.4–10.5)
Chloride: 101 meq/L (ref 96–112)
Creatinine, Ser: 0.6 mg/dL (ref 0.4–1.2)
GFR: 106.39 mL/min
Glucose, Bld: 92 mg/dL (ref 70–99)
Potassium: 4.2 meq/L (ref 3.5–5.1)
Sodium: 137 meq/L (ref 135–145)
Total Bilirubin: 0.6 mg/dL (ref 0.3–1.2)
Total Protein: 7.7 g/dL (ref 6.0–8.3)

## 2012-12-31 LAB — LDL CHOLESTEROL, DIRECT: Direct LDL: 110.3 mg/dL

## 2013-01-04 ENCOUNTER — Encounter: Payer: Self-pay | Admitting: Family Medicine

## 2013-01-04 ENCOUNTER — Ambulatory Visit (INDEPENDENT_AMBULATORY_CARE_PROVIDER_SITE_OTHER): Payer: BC Managed Care – PPO | Admitting: Family Medicine

## 2013-01-04 VITALS — BP 100/60 | HR 69 | Temp 98.4°F | Ht 64.5 in | Wt 122.5 lb

## 2013-01-04 DIAGNOSIS — F411 Generalized anxiety disorder: Secondary | ICD-10-CM

## 2013-01-04 DIAGNOSIS — F329 Major depressive disorder, single episode, unspecified: Secondary | ICD-10-CM

## 2013-01-04 DIAGNOSIS — R109 Unspecified abdominal pain: Secondary | ICD-10-CM | POA: Insufficient documentation

## 2013-01-04 DIAGNOSIS — Z0000 Encounter for general adult medical examination without abnormal findings: Secondary | ICD-10-CM

## 2013-01-04 DIAGNOSIS — I10 Essential (primary) hypertension: Secondary | ICD-10-CM

## 2013-01-04 NOTE — Patient Instructions (Addendum)
Call to schedule colonoscopy with Dr. Bluford Kaufmann.  Work on increasing fiber and water in diet. Avoid greasy foods. Call if abdominal pain not improving.

## 2013-01-04 NOTE — Assessment & Plan Note (Signed)
?   IBS, pain predominant.  Hx of collagenous colitis but no current diarrhea. Pain is mild in adenxa on right , mainly above... If colonscopy nml, but pain continuing pt will call for likely US pelvis.

## 2013-01-04 NOTE — Progress Notes (Signed)
Subjective:   Patient ID: Bridget Reeves, female DOB: 07/13/1960, 52 y.o. MRN: [id number]  HPI  The patient is here for annual wellness exam and preventative care.  Hypertension: Well controlled on linisopril/HCTZ.  lightheadedness: None  Chest pain with exertion:None  Edema:None  Short of breath:None  Average home BPs:not checking Other issues:   Depression, well controlled on celexa.   Reviewed labs in detial including DM and chol screen with pt.  LDL < 130. Lab Results  Component Value Date   CHOL 238* 12/31/2012   HDL 100.30 12/31/2012   LDLCALC 88 04/20/2010   LDLDIRECT 110.3 12/31/2012   TRIG 94.0 12/31/2012   CHOLHDL 2 12/31/2012   Seeing Dr. Barrington Ellison for abdominal pain last year. Pain in right lower mid abdomen now.. Improves with BM.  Has collagenous colitits, Entocort was not helping. Diarrhea, chronic now improved with diet. [place] of gallbladder nml. No blood in stool.     Review of Systems  Constitutional: Negative for fever, fatigue and unexpected weight change.  HENT: Negative for ear pain, congestion, sore throat, sneezing, trouble swallowing and sinus pressure.  Eyes: Negative for pain and itching.  Respiratory: Negative for cough, shortness of breath and wheezing.  Cardiovascular: Negative for chest pain, palpitations and leg swelling.  Gastrointestinal: Positive for abdominal pain  Negative for nausea, diarrhea constipation and blood in stool.  Genitourinary: Negative for dysuria, hematuria, vaginal discharge, difficulty urinating and menstrual problem.  Skin: Negative for rash.  Neurological: Negative for syncope, weakness, light-headedness, numbness and headaches.  Psychiatric/Behavioral: Negative for confusion and dysphoric mood. The patient is not nervous/anxious.  Objective:   Physical Exam  Constitutional: Vital signs are normal. She appears well-developed and well-nourished. She is cooperative. Non-toxic appearance. She does not appear ill.  No distress.  HENT:  Head: Normocephalic.  Right Ear: Hearing, tympanic membrane, external ear and ear canal normal.  Left Ear: Hearing, tympanic membrane, external ear and ear canal normal.  Nose: Nose normal.  Eyes: Conjunctivae, EOM and lids are normal. Pupils are equal, round, and reactive to light. No foreign bodies found.  Neck: Trachea normal and normal range of motion. Neck supple. Carotid bruit is not present. No mass and no thyromegaly present.  Cardiovascular: Normal rate, regular rhythm, S1 normal, S2 normal, normal heart sounds and intact distal pulses. Exam reveals no gallop.  No murmur heard.  Pulmonary/Chest: Effort normal and breath sounds normal. No respiratory distress. She has no wheezes. She has no rhonchi. She has no rales.  Abdominal: Soft. Normal appearance and bowel sounds are normal. She exhibits no distension, no fluid wave, no abdominal bruit and no mass. There is no hepatosplenomegaly.  Tenderness in right lower quadrant... But above adenexa. There is no rebound, no guarding and no CVA tenderness. No hernia.  Genitourinary: Vagina normal and uterus normal. No breast swelling, tenderness, discharge or bleeding. Pelvic exam was performed with patient prone. There is no rash, tenderness or lesion on the right labia. There is no rash, tenderness or lesion on the left labia. Uterus is not enlarged and not tender. Cervix exhibits no motion tenderness, no discharge and no friability. Right adnexum displays no mass, mild tenderness and no fullness. Left adnexum displays no mass, no tenderness and no fullness.  Pap NOT performed.  Lymphadenopathy:  She has no cervical adenopathy.  She has no axillary adenopathy.  Neurological: She is alert. She has normal strength. No cranial nerve deficit or sensory deficit.  Skin: Skin is warm, dry and intact. No rash  noted.  Psychiatric: Her speech is normal and behavior is normal. Judgment normal. Her mood appears not anxious. Cognition and  memory are normal. She does not exhibit a depressed mood.  Assessment & Plan:   The patient's preventative maintenance and recommended screening tests for an annual wellness exam were reviewed in full today.  Brought up to date unless services declined.  Counselled on the importance of diet, exercise, and its role in overall health and mortality.  The patient's FH and SH was reviewed, including their home life, tobacco status, and drug and alcohol status.   Vaccines: Uptodate with Td and flu Mammo: 11/29/2012  DEXA:Mother with osteoporosis, smoker, no past steroids, Nml DEXA 11/1012. Repeat in 5 years. PAP/DVE:PAP nml , now space to every 3 years. DVE yearly.  Last menses 05/2011  Colon:Colonoscopy 12/2007 collagenous colitis, followed at [facility]. Repeat in 5 year... Due now. Marijuana smoker, occ.

## 2013-01-04 NOTE — Assessment & Plan Note (Signed)
Well controlled. Continue current medication.  

## 2013-01-18 ENCOUNTER — Other Ambulatory Visit: Payer: Self-pay | Admitting: Family Medicine

## 2013-03-04 ENCOUNTER — Ambulatory Visit: Payer: Self-pay | Admitting: Gastroenterology

## 2013-04-07 ENCOUNTER — Other Ambulatory Visit: Payer: Self-pay | Admitting: Family Medicine

## 2013-05-16 ENCOUNTER — Telehealth: Payer: Self-pay | Admitting: Family Medicine

## 2013-05-16 DIAGNOSIS — R928 Other abnormal and inconclusive findings on diagnostic imaging of breast: Secondary | ICD-10-CM

## 2013-05-16 NOTE — Telephone Encounter (Signed)
[facility]ridget [facility] from [facility] called to request 6 month follow up diagnostic MM for Bridget Reeves. Please advise.

## 2013-07-23 ENCOUNTER — Other Ambulatory Visit: Payer: Self-pay | Admitting: Family Medicine

## 2013-08-13 ENCOUNTER — Other Ambulatory Visit: Payer: Self-pay | Admitting: Family Medicine

## 2014-01-05 ENCOUNTER — Telehealth: Payer: Self-pay | Admitting: Family Medicine

## 2014-01-05 DIAGNOSIS — I10 Essential (primary) hypertension: Secondary | ICD-10-CM

## 2014-01-05 NOTE — Telephone Encounter (Signed)
Message copied by Excell SeltzerBEDSOLE, Maisen Schmit E on Sun Jan 05, 2014 11:13 PM ------      Message from: Alvina ChouWALSH, TERRI J      Created: Wed Jan 01, 2014  4:10 PM      Regarding: Lab orders for Monday, 10.26.15       Patient is scheduled for CPX labs, please order future labs, Thanks , Terri       ------

## 2014-01-06 ENCOUNTER — Other Ambulatory Visit: Payer: BC Managed Care – PPO

## 2014-01-10 ENCOUNTER — Encounter: Payer: BC Managed Care – PPO | Admitting: Family Medicine

## 2014-01-28 ENCOUNTER — Other Ambulatory Visit: Payer: Self-pay | Admitting: Family Medicine

## 2014-02-12 ENCOUNTER — Other Ambulatory Visit: Payer: Self-pay | Admitting: Family Medicine

## 2014-03-17 ENCOUNTER — Other Ambulatory Visit: Payer: BC Managed Care – PPO

## 2014-03-18 ENCOUNTER — Encounter: Payer: BC Managed Care – PPO | Admitting: Family Medicine

## 2014-05-15 ENCOUNTER — Telehealth: Payer: Self-pay | Admitting: Family Medicine

## 2014-05-15 DIAGNOSIS — I10 Essential (primary) hypertension: Secondary | ICD-10-CM

## 2014-05-15 NOTE — Telephone Encounter (Signed)
-----   Message from Terri J Walsh sent at 05/07/2014 11:52 AM EST ----- Regarding: Lab orders for Friday, 3.4.16 Patient is scheduled for CPX labs, please order future labs, Thanks , Terri  

## 2014-05-16 ENCOUNTER — Other Ambulatory Visit (INDEPENDENT_AMBULATORY_CARE_PROVIDER_SITE_OTHER): Payer: BLUE CROSS/BLUE SHIELD

## 2014-05-16 DIAGNOSIS — I10 Essential (primary) hypertension: Secondary | ICD-10-CM

## 2014-05-16 LAB — LIPID PANEL
Cholesterol: 235 mg/dL — ABNORMAL HIGH (ref 0–200)
HDL: 88 mg/dL (ref >39.00)
LDL Cholesterol: 128 mg/dL — ABNORMAL HIGH (ref 0–99)
NONHDL: 147
Total CHOL/HDL Ratio: 3
Triglycerides: 97 mg/dL (ref 0.0–149.0)
VLDL: 19.4 mg/dL (ref 0.0–40.0)

## 2014-05-16 LAB — COMPREHENSIVE METABOLIC PANEL
ALT: 14 U/L (ref 0–35)
AST: 22 U/L (ref 0–37)
Albumin: 4.7 g/dL (ref 3.5–5.2)
Alkaline Phosphatase: 60 U/L (ref 39–117)
BILIRUBIN TOTAL: 0.5 mg/dL (ref 0.2–1.2)
BUN: 23 mg/dL (ref 6–23)
CHLORIDE: 101 meq/L (ref 96–112)
CO2: 31 mEq/L (ref 19–32)
Calcium: 9.8 mg/dL (ref 8.4–10.5)
Creatinine, Ser: 0.69 mg/dL (ref 0.40–1.20)
GFR: 93.56 mL/min (ref >60.00)
Glucose, Bld: 100 mg/dL — ABNORMAL HIGH (ref 70–99)
Potassium: 4.1 mEq/L (ref 3.5–5.1)
SODIUM: 135 meq/L (ref 135–145)
TOTAL PROTEIN: 7.1 g/dL (ref 6.0–8.3)

## 2014-05-20 ENCOUNTER — Other Ambulatory Visit: Payer: Self-pay | Admitting: Family Medicine

## 2014-05-20 ENCOUNTER — Ambulatory Visit (INDEPENDENT_AMBULATORY_CARE_PROVIDER_SITE_OTHER): Payer: BLUE CROSS/BLUE SHIELD | Admitting: Family Medicine

## 2014-05-20 ENCOUNTER — Other Ambulatory Visit (HOSPITAL_COMMUNITY)
Admission: RE | Admit: 2014-05-20 | Discharge: 2014-05-20 | Disposition: A | Discharge status: Home / Self Care | Payer: BLUE CROSS/BLUE SHIELD | Source: Ambulatory Visit | Attending: Family Medicine | Admitting: Family Medicine

## 2014-05-20 ENCOUNTER — Encounter: Payer: Self-pay | Admitting: Family Medicine

## 2014-05-20 VITALS — BP 130/76 | HR 74 | Temp 98.3°F | Ht 64.0 in | Wt 127.2 lb

## 2014-05-20 DIAGNOSIS — Z23 Encounter for immunization: Secondary | ICD-10-CM

## 2014-05-20 DIAGNOSIS — Z01419 Encounter for gynecological examination (general) (routine) without abnormal findings: Secondary | ICD-10-CM | POA: Insufficient documentation

## 2014-05-20 DIAGNOSIS — F331 Major depressive disorder, recurrent, moderate: Secondary | ICD-10-CM

## 2014-05-20 DIAGNOSIS — I10 Essential (primary) hypertension: Secondary | ICD-10-CM

## 2014-05-20 DIAGNOSIS — Z0000 Encounter for general adult medical examination without abnormal findings: Secondary | ICD-10-CM

## 2014-05-20 DIAGNOSIS — Z124 Encounter for screening for malignant neoplasm of cervix: Secondary | ICD-10-CM

## 2014-05-20 MED ORDER — ESCITALOPRAM OXALATE 10 MG PO TABS: ORAL_TABLET | ORAL | Status: DC

## 2014-05-20 NOTE — Progress Notes (Signed)
Pre visit review using our clinic review tool, if applicable. No additional management support is needed unless otherwise documented below in the visit note. 

## 2014-05-20 NOTE — Assessment & Plan Note (Signed)
Well controlled. Continue current medication.  

## 2014-05-20 NOTE — Assessment & Plan Note (Signed)
Poor control. Stop citalopram and change to lexapro 10 mg, titrate up to 20 mg daily.

## 2014-05-20 NOTE — Progress Notes (Signed)
The patient is here for annual wellness exam and preventative care.  Hypertension: Well controlled on linisopril/HCTZ.  BP Readings from Last 3 Encounters:  05/20/14 130/76  01/04/13 100/60  11/10/11 136/78  lightheadedness: None  Chest pain with exertion:None  Edema:None  Short of breath:None  Average home BPs:not checking Other issues:   Depression, poorly controlled on celexa.  She feels it is not working well anymore. She is very anxious, road rage a lot. Trouble sleeping at night.. Wakes up a lot.  No SI, No HI. Has never been on other meds in past.  Seeing ENT Dr. Harlene Ramus for tinnitus. 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  Reviewed labs in detail including DM and chol screen with pt. LDL < 130. Lab Results  Component Value Date   CHOL 235* 05/16/2014   HDL 88.00 05/16/2014   LDLCALC 128* 05/16/2014   LDLDIRECT 110.3 12/31/2012   TRIG 97.0 05/16/2014   CHOLHDL 3 05/16/2014     Has collagenous colitits, Entocort was not helping. Diarrhea, chronic now improved with diet. [place] of gallbladder nml. No blood in stool.     Review of Systems  Constitutional: Negative for fever, fatigue and unexpected weight change.  HENT: Negative for ear pain, congestion, sore throat, sneezing, trouble swallowing and sinus pressure.  Eyes: Negative for pain and itching.  Respiratory: Negative for cough, shortness of breath and wheezing.  Cardiovascular: Negative for chest pain, palpitations and leg swelling.  Gastrointestinal: Positive for abdominal pain Negative for nausea, diarrhea constipation and blood in stool.  Genitourinary: Negative for dysuria, hematuria, vaginal discharge, difficulty urinating and menstrual problem.  Skin: Negative for rash.  Neurological: Negative for syncope, weakness, light-headedness, numbness and headaches.  Psychiatric/Behavioral: Negative for confusion and dysphoric mood. The patient is not nervous/anxious.   Objective:   Physical Exam  Constitutional: Vital signs are normal. She appears well-developed and well-nourished. She is cooperative. Non-toxic appearance. She does not appear ill. No distress.  HENT:  Head: Normocephalic.  Right Ear: Hearing, tympanic membrane, external ear and ear canal normal.  Left Ear: Hearing, tympanic membrane, external ear and ear canal normal.  Nose: Nose normal.  Eyes: Conjunctivae, EOM and lids are normal. Pupils are equal, round, and reactive to light. No foreign bodies found.  Neck: Trachea normal and normal range of motion. Neck supple. Carotid bruit is not present. No mass and no thyromegaly present.  Cardiovascular: Normal rate, regular rhythm, S1 normal, S2 normal, normal heart sounds and intact distal pulses. Exam reveals no gallop.  No murmur heard.  Pulmonary/Chest: Effort normal and breath sounds normal. No respiratory distress. She has no wheezes. She has no rhonchi. She has no rales.  Abdominal: Soft. Normal appearance and bowel sounds are normal. She exhibits no distension, no fluid wave, no abdominal bruit and no mass. There is no hepatosplenomegaly. Tenderness in right lower quadrant... But above adenexa. There is no rebound, no guarding and no CVA tenderness. No hernia.  Genitourinary: Vagina normal and uterus normal. No breast swelling, tenderness, discharge or bleeding. Pelvic exam was performed with patient prone. There is no rash, tenderness or lesion on the right labia. There is no rash, tenderness or lesion on the left labia. Uterus is not enlarged and not tender. Cervix exhibits no motion tenderness, no discharge and no friability. Right adnexum displays no mass, mild tenderness and no fullness. Left adnexum displays no mass, no tenderness and no fullness.  Pap performed.   Lymphadenopathy:  She has no cervical adenopathy.  She has no axillary adenopathy.  Neurological: She is alert. She has normal strength. No cranial nerve deficit  or sensory deficit.  Skin: Skin is warm, dry and intact. No rash noted.  Psychiatric: Her speech is normal and behavior is normal. Judgment normal. Her mood appears not anxious. Cognition and memory are normal. She does not exhibit a depressed mood.  Assessment & Plan:   The patient's preventative maintenance and recommended screening tests for an annual wellness exam were reviewed in full today.  Brought up to date unless services declined.  Counselled on the importance of diet, exercise, and its role in overall health and mortality.  The patient's FH and SH was reviewed, including their home life, tobacco status, and drug and alcohol status.   Vaccines: Uptodate with Td and flu Mammo: 11/29/2012  DEXA:Mother with osteoporosis, smoker, no past steroids, Nml  DEXA 11/1012. Repeat in 5 years. PAP/DVE:PAP nml 2013, now space to every 3 years DVE yearly.  Colon:Colonoscopy 02/2013 collagenous colitis, [facility] Dr. Bluford Kaufmannh. Marijuana smoker, occ.

## 2014-05-20 NOTE — Patient Instructions (Addendum)
Call to schedule mammogram on your own. Start lexapro 10 mg daily if tolerating increase to 20 mg daily. Use lubricant, call if not helping.

## 2014-05-20 NOTE — Addendum Note (Signed)
Addended by: Damita LackLORING, Joleene Burnham S on: 05/20/2014 12:38 PM   Modules accepted: Orders

## 2014-05-21 ENCOUNTER — Telehealth: Payer: Self-pay | Admitting: Family Medicine

## 2014-05-21 LAB — CYTOLOGY - PAP

## 2014-05-21 NOTE — Telephone Encounter (Signed)
emmi emailed °

## 2014-07-15 ENCOUNTER — Other Ambulatory Visit: Payer: Self-pay | Admitting: *Deleted

## 2014-07-15 MED ORDER — ESCITALOPRAM OXALATE 20 MG PO TABS: 20.0000 mg | ORAL_TABLET | Freq: Every day | ORAL | Status: DC

## 2014-07-25 ENCOUNTER — Other Ambulatory Visit: Payer: Self-pay | Admitting: *Deleted

## 2014-07-25 MED ORDER — ESCITALOPRAM OXALATE 20 MG PO TABS
20.0000 mg | ORAL_TABLET | Freq: Every day | ORAL | Status: DC
Start: 2014-07-25 — End: 2015-05-27

## 2015-05-14 ENCOUNTER — Other Ambulatory Visit: Payer: Self-pay | Admitting: Family Medicine

## 2015-05-14 ENCOUNTER — Telehealth: Payer: Self-pay | Admitting: Family Medicine

## 2015-05-14 ENCOUNTER — Other Ambulatory Visit (INDEPENDENT_AMBULATORY_CARE_PROVIDER_SITE_OTHER): Payer: BLUE CROSS/BLUE SHIELD

## 2015-05-14 DIAGNOSIS — Z1159 Encounter for screening for other viral diseases: Secondary | ICD-10-CM

## 2015-05-14 DIAGNOSIS — I10 Essential (primary) hypertension: Secondary | ICD-10-CM

## 2015-05-14 DIAGNOSIS — Z113 Encounter for screening for infections with a predominantly sexual mode of transmission: Secondary | ICD-10-CM | POA: Diagnosis not present

## 2015-05-14 LAB — COMPREHENSIVE METABOLIC PANEL
ALBUMIN: 5 g/dL (ref 3.5–5.2)
ALT: 16 U/L (ref 0–35)
AST: 28 U/L (ref 0–37)
Alkaline Phosphatase: 62 U/L (ref 39–117)
BUN: 22 mg/dL (ref 6–23)
CALCIUM: 10.3 mg/dL (ref 8.4–10.5)
CHLORIDE: 101 meq/L (ref 96–112)
CO2: 29 mEq/L (ref 19–32)
CREATININE: 0.76 mg/dL (ref 0.40–1.20)
GFR: 83.39 mL/min (ref >60.00)
Glucose, Bld: 95 mg/dL (ref 70–99)
POTASSIUM: 4.1 meq/L (ref 3.5–5.1)
Sodium: 138 mEq/L (ref 135–145)
Total Bilirubin: 0.6 mg/dL (ref 0.2–1.2)
Total Protein: 7.6 g/dL (ref 6.0–8.3)

## 2015-05-14 LAB — LIPID PANEL
CHOLESTEROL: 229 mg/dL — AB (ref 0–200)
HDL: 93 mg/dL (ref >39.00)
LDL CALC: 117 mg/dL — AB (ref 0–99)
NonHDL: 135.99
TRIGLYCERIDES: 94 mg/dL (ref 0.0–149.0)
Total CHOL/HDL Ratio: 2
VLDL: 18.8 mg/dL (ref 0.0–40.0)

## 2015-05-14 LAB — HIV ANTIBODY (ROUTINE TESTING W REFLEX): HIV 1&2 Ab, 4th Generation: NONREACTIVE

## 2015-05-14 NOTE — Telephone Encounter (Signed)
-----   Message from Baldomero Lamy sent at 05/07/2015 10:42 AM EST ----- Regarding: Cpx labs Thurs 3/2, need orders. Thanks! :-) Please order  future cpx labs for pt's upcoming lab appt. Thanks Rodney Booze

## 2015-05-15 LAB — HEPATITIS C ANTIBODY: HCV AB: NEGATIVE

## 2015-05-19 ENCOUNTER — Telehealth: Payer: Self-pay | Admitting: Family Medicine

## 2015-05-19 DIAGNOSIS — R928 Other abnormal and inconclusive findings on diagnostic imaging of breast: Secondary | ICD-10-CM

## 2015-05-19 DIAGNOSIS — Z1231 Encounter for screening mammogram for malignant neoplasm of breast: Secondary | ICD-10-CM

## 2015-05-19 NOTE — Telephone Encounter (Signed)
Referral sent pt pt request.

## 2015-05-19 NOTE — Telephone Encounter (Signed)
Pt would like to get a mammogram.  She was told by [facility] that she needs to have a referral for this. cb number for pt is [phone]

## 2015-05-20 NOTE — Telephone Encounter (Signed)
Spoke to pt   Pt will call [facility] to make her own appointment

## 2015-05-20 NOTE — Telephone Encounter (Signed)
Please see below request for MM

## 2015-05-21 ENCOUNTER — Encounter: Payer: BLUE CROSS/BLUE SHIELD | Admitting: Family Medicine

## 2015-05-27 ENCOUNTER — Other Ambulatory Visit: Payer: Self-pay | Admitting: Family Medicine

## 2015-05-29 NOTE — Addendum Note (Signed)
Addended byKerby Nora: Bridget Reeves E on: 05/29/2015 01:19 PM   Modules accepted: Orders

## 2015-05-29 NOTE — Telephone Encounter (Signed)
Pt left v/m requesting a diagnostic mammogram order [facility]; [facility] would not accept a screening mammo order due to needing f/u diagnostic mammo from 2014. Pt request cb when done. Pt will call and schedule her appt.

## 2015-05-29 NOTE — Telephone Encounter (Signed)
Diagnostic ordered.  

## 2015-06-03 ENCOUNTER — Telehealth: Payer: Self-pay | Admitting: Family Medicine

## 2015-06-03 DIAGNOSIS — Z87898 Personal history of other specified conditions: Secondary | ICD-10-CM

## 2015-06-03 NOTE — Telephone Encounter (Signed)
Bridget Reeves needs uni right and left ultra sound before making dig mammogram

## 2015-06-04 NOTE — Telephone Encounter (Signed)
What is diagnosis?

## 2015-06-04 NOTE — Telephone Encounter (Signed)
See Phone Note from 05/19/2015.  Orders placed in Epic as requested.

## 2015-06-04 NOTE — Telephone Encounter (Signed)
Left message at [facility] to call back and left Dr. Ermalene SearingBedsole know what the diagnosis is for the orders they are requesting.

## 2015-06-30 ENCOUNTER — Ambulatory Visit
Admission: RE | Admit: 2015-06-30 | Discharge: 2015-06-30 | Disposition: A | Discharge status: Home / Self Care | Payer: BLUE CROSS/BLUE SHIELD | Source: Ambulatory Visit | Attending: Family Medicine | Admitting: Family Medicine

## 2015-06-30 ENCOUNTER — Other Ambulatory Visit: Payer: Self-pay | Admitting: Family Medicine

## 2015-06-30 DIAGNOSIS — R928 Other abnormal and inconclusive findings on diagnostic imaging of breast: Secondary | ICD-10-CM | POA: Diagnosis not present

## 2015-06-30 DIAGNOSIS — Z87898 Personal history of other specified conditions: Secondary | ICD-10-CM

## 2015-08-25 ENCOUNTER — Other Ambulatory Visit: Payer: Self-pay | Admitting: Family Medicine

## 2015-09-22 ENCOUNTER — Encounter: Payer: BLUE CROSS/BLUE SHIELD | Admitting: Family Medicine

## 2015-10-05 ENCOUNTER — Other Ambulatory Visit: Payer: Self-pay | Admitting: *Deleted

## 2015-10-05 NOTE — Telephone Encounter (Signed)
Last office visit 05/20/2014.  Cancelled last CPE 09/22/2015.  No future appointments.  Refill?

## 2015-10-06 NOTE — Telephone Encounter (Signed)
refill once appointment scheduled.

## 2015-11-03 ENCOUNTER — Other Ambulatory Visit: Payer: Self-pay | Admitting: Family Medicine

## 2015-11-03 NOTE — Telephone Encounter (Signed)
Last office visit 05/20/2014.Marland Kitchen. Cancelled appointments on 09/22/2015 & 05/21/2015.  No future appointments.  Refill?

## 2017-05-25 ENCOUNTER — Other Ambulatory Visit: Payer: Self-pay | Admitting: Family Medicine

## 2017-05-25 DIAGNOSIS — Z1231 Encounter for screening mammogram for malignant neoplasm of breast: Secondary | ICD-10-CM

## 2017-05-31 ENCOUNTER — Ambulatory Visit
Admission: RE | Admit: 2017-05-31 | Discharge: 2017-05-31 | Disposition: A | Discharge status: Home / Self Care | Payer: BLUE CROSS/BLUE SHIELD | Source: Ambulatory Visit | Attending: Family Medicine | Admitting: Family Medicine

## 2017-05-31 DIAGNOSIS — Z1231 Encounter for screening mammogram for malignant neoplasm of breast: Secondary | ICD-10-CM

## 2017-12-28 ENCOUNTER — Other Ambulatory Visit: Payer: Self-pay | Admitting: Family Medicine

## 2017-12-28 DIAGNOSIS — Z780 Asymptomatic menopausal state: Secondary | ICD-10-CM

## 2018-05-23 ENCOUNTER — Other Ambulatory Visit: Payer: Self-pay | Admitting: Family Medicine

## 2018-05-23 DIAGNOSIS — R1011 Right upper quadrant pain: Secondary | ICD-10-CM

## 2018-05-25 ENCOUNTER — Ambulatory Visit
Admission: RE | Admit: 2018-05-25 | Discharge: 2018-05-25 | Disposition: A | Discharge status: Home / Self Care | Payer: BLUE CROSS/BLUE SHIELD | Source: Ambulatory Visit | Attending: Family Medicine | Admitting: Family Medicine

## 2018-05-25 ENCOUNTER — Other Ambulatory Visit: Payer: Self-pay

## 2018-05-25 DIAGNOSIS — R1011 Right upper quadrant pain: Secondary | ICD-10-CM | POA: Insufficient documentation

## 2018-05-25 MED ORDER — IOHEXOL 300 MG/ML  SOLN
75.0000 mL | Freq: Once | INTRAMUSCULAR | Status: AC | PRN
  Administered 2018-05-25: 75 mL via INTRAVENOUS

## 2018-06-05 ENCOUNTER — Other Ambulatory Visit: Payer: Self-pay | Admitting: Family Medicine

## 2018-06-05 ENCOUNTER — Other Ambulatory Visit (HOSPITAL_COMMUNITY): Payer: Self-pay | Admitting: Family Medicine

## 2018-06-05 DIAGNOSIS — R1011 Right upper quadrant pain: Secondary | ICD-10-CM

## 2018-06-05 DIAGNOSIS — K7689 Other specified diseases of liver: Secondary | ICD-10-CM

## 2018-06-08 ENCOUNTER — Ambulatory Visit
Admission: RE | Admit: 2018-06-08 | Discharge: 2018-06-08 | Disposition: A | Discharge status: Home / Self Care | Payer: BLUE CROSS/BLUE SHIELD | Source: Ambulatory Visit | Attending: Family Medicine | Admitting: Family Medicine

## 2018-06-08 ENCOUNTER — Other Ambulatory Visit: Payer: Self-pay

## 2018-06-08 DIAGNOSIS — K7689 Other specified diseases of liver: Secondary | ICD-10-CM | POA: Insufficient documentation

## 2018-06-08 DIAGNOSIS — R1011 Right upper quadrant pain: Secondary | ICD-10-CM | POA: Insufficient documentation

## 2023-06-20 ENCOUNTER — Ambulatory Visit: Payer: Self-pay

## 2023-06-20 DIAGNOSIS — Z860101 Personal history of adenomatous and serrated colon polyps: Secondary | ICD-10-CM | POA: Diagnosis not present

## 2023-06-20 DIAGNOSIS — Z09 Encounter for follow-up examination after completed treatment for conditions other than malignant neoplasm: Secondary | ICD-10-CM | POA: Diagnosis not present

## 2023-06-20 DIAGNOSIS — K5730 Diverticulosis of large intestine without perforation or abscess without bleeding: Secondary | ICD-10-CM | POA: Diagnosis not present

## 2023-06-20 DIAGNOSIS — K640 First degree hemorrhoids: Secondary | ICD-10-CM | POA: Diagnosis not present
